# Patient Record
Sex: Male | Born: 1965
Health system: Southern US, Community
[De-identification: ages and names within clinical notes are randomized; demographics above are authoritative.]

## PROBLEM LIST (undated history)

## (undated) DIAGNOSIS — R011 Cardiac murmur, unspecified: Secondary | ICD-10-CM

## (undated) DIAGNOSIS — E3120 Multiple endocrine neoplasia [MEN] syndrome, unspecified: Secondary | ICD-10-CM

---

## 2015-03-14 HISTORY — PX: COLONOSCOPY: SHX174

## 2015-04-16 ENCOUNTER — Ambulatory Visit: Payer: Self-pay | Admitting: General Surgery

## 2015-04-16 NOTE — H&P (Signed)
Robert Sloan 04/03/2015 1:20 PM Location: [facility] Patient #: [id number] DOB: 1966-01-24 Married / Language: [place] / Race: White Male   History of Present Illness [name] MD; 04/03/2015 2:10 PM) Patient words: umbilical hernia.  The patient is a 50 year old male.  Note:He is referred by Dr. Loreta Ave for consultation on a left inguinal hernia. He has noticed a bulge for some time that has progressively enlarged and migrated into his left scrotum. He states he gets a funny sensation there at times but no significant pain. No difficulty with urination or chronic constipation. He does not do a lot of repetitive heavy lifting. His wife is here with him today. His twin brother had an inguinal hernia repair done about a year ago.  Problem List/Past Medical [name], MD; 04/03/2015 2:06 PM) MEN 1 (MULTIPLE ENDOCRINE NEOPLASIA) (E31.21)  Other Problems [name], MD; 04/03/2015 2:06 PM) Back Pain Heart murmur Inguinal Hernia Other disease, cancer, significant illness  Past Surgical History [name], CMA; 04/03/2015 1:29 PM) Oral Surgery Vasectomy  Diagnostic Studies History [name], CMA; 04/03/2015 1:29 PM) Colonoscopy within last year  Allergies [name], CMA; 04/03/2015 1:30 PM) No Known Drug Allergies02/21/2017  Medication History [name], CMA; 04/03/2015 1:30 PM) Medications Reconciled  Social History [name], CMA; 04/03/2015 1:29 PM) Alcohol use Occasional alcohol use. Caffeine use Carbonated beverages, Coffee. No drug use Tobacco use Never smoker.  Family History [name], [place]; 04/03/2015 1:29 PM) Cancer Father. Hypertension Father. Melanoma Mother.    Review of Systems [name] CMA; 04/03/2015 1:29 PM) General Not Present- Appetite Loss, Chills, Fatigue, Fever, Night Sweats, Weight Gain and Weight Loss. Skin Not Present- Change in Wart/Mole, Dryness, Hives, Jaundice,  New Lesions, Non-Healing Wounds, Rash and Ulcer. HEENT Present- Wears glasses/contact lenses. Not Present- Earache, Hearing Loss, Hoarseness, Nose Bleed, Oral Ulcers, Ringing in the Ears, Seasonal Allergies, Sinus Pain, Sore Throat, Visual Disturbances and Yellow Eyes. Respiratory Not Present- Bloody sputum, Chronic Cough, Difficulty Breathing, Snoring and Wheezing. Breast Not Present- Breast Mass, Breast Pain, Nipple Discharge and Skin Changes. Cardiovascular Not Present- Chest Pain, Difficulty Breathing Lying Down, Leg Cramps, Palpitations, Rapid Heart Rate, Shortness of Breath and Swelling of Extremities. Gastrointestinal Present- Abdominal Pain and Bloating. Not Present- Bloody Stool, Change in Bowel Habits, Chronic diarrhea, Constipation, Difficulty Swallowing, Excessive gas, Gets full quickly at meals, Hemorrhoids, Indigestion, Nausea, Rectal Pain and Vomiting. Male Genitourinary Not Present- Blood in Urine, Change in Urinary Stream, Frequency, Impotence, Nocturia, Painful Urination, Urgency and Urine Leakage. Musculoskeletal Present- Back Pain. Not Present- Joint Pain, Joint Stiffness, Muscle Pain, Muscle Weakness and Swelling of Extremities. Neurological Not Present- Decreased Memory, Fainting, Headaches, Numbness, Seizures, Tingling, Tremor, Trouble walking and Weakness. Psychiatric Not Present- Anxiety, Bipolar, Change in Sleep Pattern, Depression, Fearful and Frequent crying. Endocrine Not Present- Cold Intolerance, Excessive Hunger, Hair Changes, Heat Intolerance, Hot flashes and New Diabetes. Hematology Not Present- Easy Bruising, Excessive bleeding, Gland problems, HIV and Persistent Infections.  Vitals [name] CMA; 04/03/2015 1:30 PM) 04/03/2015 1:30 PM Weight: 177.6 lb Height: 70in Body Surface Area: 1.98 m Body Mass Index: 25.48 kg/m  Temp.: 98.5F(Oral)  Pulse: 62 (Regular)  BP: 110/60 (Sitting, Left Arm, Standard)       Physical Exam [name]  MD; 04/03/2015 2:12 PM) The physical exam findings are as follows: Note:General: WDWN in NAD. Pleasant and cooperative.  CV: RRR, no murmur, no JVD.  CHEST: Breath sounds equal and clear. Respirations nonlabored.  ABDOMEN: Soft, nontender,  nondistended, no umbilical hernia  GU: Left inguinal bulge extending down into his scrotum abutting the testicle. This is able to be reduced with some effort in the supine position. No right inguinal bulge. Both testicles are present without masses palpable.  SKIN: No jaundice.  NEUROLOGIC: Alert and oriented, answers questions appropriately, normal gait and station.  PSYCHIATRIC: Normal mood, affect , and behavior.    Assessment & Plan [name]([name] MD; 04/03/2015 2:08 PM) LEFT INGUINAL HERNIA (K40.90) Impression: This extends down into his scrotum and abuts his testicle. It is reducible with some effort in the supine position. Because of this, I think the open repair would be better.  Plan: Open left inguinal hernia repair with mesh. I have explained the procedure, risks, and aftercare of inguinal hernia repair. Risks include but are not limited to bleeding, infection, wound problems, anesthesia, recurrence, bladder or intestine injury, urinary retention, testicular dysfunction, chronic pain, mesh problems. He seems to understand and agrees to proceed.   Avel Peaceodd Mykell Genao, MD

## 2015-04-19 NOTE — Pre-Procedure Instructions (Addendum)
Robert Sloan  04/19/2015     No Pharmacies Listed   Your procedure is scheduled on Thursday, March 16th, 2017.  Report to [facility] Admitting at 5:30 A.M.   Call this number if you have problems the morning of surgery:  [phone]   Remember:  Do not eat food or drink liquids after midnight.   Take these medicines the morning of surgery with A SIP OF WATER: Acetaminophen (Tylenol) if needed.   5 days prior to surgery, stop taking: Aspirin, NSAIDs, Aleve, Naproxen, Ibuprofen, Advil, Motrin, BC's, Goody's, Fish oil, all herbal medications, and all vitamins.    Do not wear jewelry.  Do not wear lotions, powders, or colognes.    Men may shave face and neck.  Do not bring valuables to the hospital.   [facility] is not responsible for any belongings or valuables.  Contacts, dentures or bridgework may not be worn into surgery.  Leave your suitcase in the car.  After surgery it may be brought to your room.  For patients admitted to the hospital, discharge time will be determined by your treatment team.  Patients discharged the day of surgery will not be allowed to drive home.   Special instructions:  Special Instructions: [place] - Preparing for Surgery  Before surgery, you can play an important role.  Because skin is not sterile, your skin needs to be as free of germs as possible.  You can reduce the number of germs on you skin by washing with CHG (chlorahexidine gluconate) soap before surgery.  CHG is an antiseptic cleaner which kills germs and bonds with the skin to continue killing germs even after washing.  Please DO NOT use if you have an allergy to CHG or antibacterial soaps.  If your skin becomes reddened/irritated stop using the CHG and inform your nurse when you arrive at Short Stay.  Do not shave (including legs and underarms) for at least 48 hours prior to the first CHG shower.  You may shave your face.  Please follow these instructions  carefully:   1.  Shower with CHG Soap the night before surgery and the morning of Surgery.  2.  If you choose to wash your hair, wash your hair first as usual with your normal shampoo.  3.  After you shampoo, rinse your hair and body thoroughly to remove the Shampoo.  4.  Use CHG as you would any other liquid soap.  You can apply chg directly  to the skin and wash gently with scrungie or a clean washcloth.  5.  Apply the CHG Soap to your body ONLY FROM THE NECK DOWN.  Do not use on open wounds or open sores.  Avoid contact with your eyes ears, mouth and genitals (private parts).  Wash genitals (private parts)       with your normal soap.  6.  Wash thoroughly, paying special attention to the area where your surgery will be performed.  7.  Thoroughly rinse your body with warm water from the neck down.  8.  DO NOT shower/wash with your normal soap after using and rinsing off the CHG Soap.  9.  Pat yourself dry with a clean towel.            10.  Wear clean pajamas.            11.  Place clean sheets on your bed the night of your first shower and do not sleep with pets.  Day  of Surgery  Do not apply any lotions/deodorants the morning of surgery.  Please wear clean clothes to the hospital/surgery center.   Please read over the following fact sheets that you were given. Pain Booklet, Coughing and Deep Breathing and Surgical Site Infection Prevention

## 2015-04-20 ENCOUNTER — Encounter (HOSPITAL_COMMUNITY): Payer: Self-pay

## 2015-04-20 ENCOUNTER — Encounter (HOSPITAL_COMMUNITY)
Admission: RE | Admit: 2015-04-20 | Discharge: 2015-04-20 | Disposition: A | Discharge status: Home / Self Care | Payer: BLUE CROSS/BLUE SHIELD | Source: Ambulatory Visit | Attending: General Surgery | Admitting: General Surgery

## 2015-04-20 DIAGNOSIS — Z01812 Encounter for preprocedural laboratory examination: Secondary | ICD-10-CM | POA: Diagnosis not present

## 2015-04-20 DIAGNOSIS — K4090 Unilateral inguinal hernia, without obstruction or gangrene, not specified as recurrent: Secondary | ICD-10-CM | POA: Insufficient documentation

## 2015-04-20 DIAGNOSIS — E3121 Multiple endocrine neoplasia [MEN] type I: Secondary | ICD-10-CM | POA: Diagnosis not present

## 2015-04-20 DIAGNOSIS — Z01818 Encounter for other preprocedural examination: Secondary | ICD-10-CM | POA: Insufficient documentation

## 2015-04-20 HISTORY — DX: Multiple endocrine neoplasia (MEN) syndrome, unspecified: E31.20

## 2015-04-20 HISTORY — DX: Cardiac murmur, unspecified: R01.1

## 2015-04-20 LAB — CBC WITH DIFFERENTIAL/PLATELET
BASOS ABS: 0 10*3/uL (ref 0.0–0.1)
BASOS PCT: 1 %
EOS ABS: 0.1 10*3/uL (ref 0.0–0.7)
EOS PCT: 2 %
HCT: 39.6 % (ref 39.0–52.0)
Hemoglobin: 12.9 g/dL — ABNORMAL LOW (ref 13.0–17.0)
LYMPHS PCT: 38 %
Lymphs Abs: 1.8 10*3/uL (ref 0.7–4.0)
MCH: 30.3 pg (ref 26.0–34.0)
MCHC: 32.6 g/dL (ref 30.0–36.0)
MCV: 93 fL (ref 78.0–100.0)
Monocytes Absolute: 0.4 10*3/uL (ref 0.1–1.0)
Monocytes Relative: 8 %
Neutro Abs: 2.4 10*3/uL (ref 1.7–7.7)
Neutrophils Relative %: 51 %
PLATELETS: 209 10*3/uL (ref 150–400)
RBC: 4.26 MIL/uL (ref 4.22–5.81)
RDW: 12.8 % (ref 11.5–15.5)
WBC: 4.7 10*3/uL (ref 4.0–10.5)

## 2015-04-20 LAB — COMPREHENSIVE METABOLIC PANEL
ALK PHOS: 83 U/L (ref 38–126)
ALT: 19 U/L (ref 17–63)
ANION GAP: 10 (ref 5–15)
AST: 21 U/L (ref 15–41)
Albumin: 3.9 g/dL (ref 3.5–5.0)
BUN: 17 mg/dL (ref 6–20)
CALCIUM: 10.8 mg/dL — AB (ref 8.9–10.3)
CO2: 24 mmol/L (ref 22–32)
CREATININE: 0.92 mg/dL (ref 0.61–1.24)
Chloride: 107 mmol/L (ref 101–111)
Glucose, Bld: 93 mg/dL (ref 65–99)
Potassium: 4.1 mmol/L (ref 3.5–5.1)
SODIUM: 141 mmol/L (ref 135–145)
Total Bilirubin: 0.5 mg/dL (ref 0.3–1.2)
Total Protein: 6.5 g/dL (ref 6.5–8.1)

## 2015-04-23 ENCOUNTER — Encounter (HOSPITAL_COMMUNITY): Payer: Self-pay

## 2015-04-23 NOTE — Progress Notes (Signed)
Anesthesia Chart Review:  Pt is a 50 year old male scheduled for open L inguinal hernia repair with mesh on 04/26/2015 wi9th Dr. Abbey Chattersosenbower.   PMH includes:  Heart murmur, MEA type 1. Never smoker. BMI 25  Preoperative labs reviewed.    EKG will need to be obtained DOS.   Echo 03/01/15:  - Normal LV size. LV systolic function normal. EF 55-60% - Normal chamber sizes and wall motion throughout - LV normal in size and contractility - Trace MR and PR and TR  If EKG acceptable DOS, I anticipate pt can proceed as scheduled.   Rica Mastngela Kabbe, FNP-BC [facility]Anesthesiology Phone: [phone] 04/23/2015 1:21 PM

## 2015-04-25 MED ORDER — CEFAZOLIN SODIUM-DEXTROSE 2-3 GM-% IV SOLR
2.0000 g | INTRAVENOUS | Status: AC
  Administered 2015-04-26: 2 g via INTRAVENOUS
  Filled 2015-04-25 (×2): qty 50

## 2015-04-26 ENCOUNTER — Ambulatory Visit (HOSPITAL_COMMUNITY): Payer: BLUE CROSS/BLUE SHIELD | Admitting: Certified Registered"

## 2015-04-26 ENCOUNTER — Encounter (HOSPITAL_COMMUNITY): Payer: Self-pay | Admitting: *Deleted

## 2015-04-26 ENCOUNTER — Encounter (HOSPITAL_COMMUNITY)
Admission: RE | Disposition: A | Discharge status: Home / Self Care | Payer: Self-pay | Source: Ambulatory Visit | Attending: General Surgery

## 2015-04-26 ENCOUNTER — Ambulatory Visit (HOSPITAL_COMMUNITY)
Admission: RE | Admit: 2015-04-26 | Discharge: 2015-04-26 | Disposition: A | Discharge status: Home / Self Care | Payer: BLUE CROSS/BLUE SHIELD | Source: Ambulatory Visit | Attending: General Surgery | Admitting: General Surgery

## 2015-04-26 ENCOUNTER — Ambulatory Visit (HOSPITAL_COMMUNITY): Payer: BLUE CROSS/BLUE SHIELD | Admitting: Emergency Medicine

## 2015-04-26 DIAGNOSIS — E3121 Multiple endocrine neoplasia [MEN] type I: Secondary | ICD-10-CM | POA: Diagnosis not present

## 2015-04-26 DIAGNOSIS — K4090 Unilateral inguinal hernia, without obstruction or gangrene, not specified as recurrent: Secondary | ICD-10-CM | POA: Diagnosis present

## 2015-04-26 HISTORY — PX: INGUINAL HERNIA REPAIR: SHX194

## 2015-04-26 HISTORY — PX: INSERTION OF MESH: SHX5868

## 2015-04-26 SURGERY — REPAIR, HERNIA, INGUINAL, ADULT
Anesthesia: Regional | Site: Groin | Laterality: Left

## 2015-04-26 MED ORDER — LIDOCAINE HCL (CARDIAC) 20 MG/ML IV SOLN
INTRAVENOUS | Status: DC | PRN
  Administered 2015-04-26: 60 mg via INTRAVENOUS

## 2015-04-26 MED ORDER — PHENYLEPHRINE HCL 10 MG/ML IJ SOLN
INTRAMUSCULAR | Status: DC | PRN
  Administered 2015-04-26 (×2): 40 ug via INTRAVENOUS

## 2015-04-26 MED ORDER — ONDANSETRON HCL 4 MG/2ML IJ SOLN
INTRAMUSCULAR | Status: DC | PRN
  Administered 2015-04-26: 4 mg via INTRAVENOUS

## 2015-04-26 MED ORDER — ONDANSETRON HCL 4 MG/2ML IJ SOLN: 4.0000 mg | Freq: Four times a day (QID) | INTRAMUSCULAR | Status: DC | PRN

## 2015-04-26 MED ORDER — MIDAZOLAM HCL 2 MG/2ML IJ SOLN
INTRAMUSCULAR | Status: AC
  Filled 2015-04-26: qty 2

## 2015-04-26 MED ORDER — 0.9 % SODIUM CHLORIDE (POUR BTL) OPTIME
TOPICAL | Status: DC | PRN
  Administered 2015-04-26: 1000 mL

## 2015-04-26 MED ORDER — MIDAZOLAM HCL 5 MG/5ML IJ SOLN
INTRAMUSCULAR | Status: DC | PRN
  Administered 2015-04-26: 2 mg via INTRAVENOUS

## 2015-04-26 MED ORDER — OXYCODONE HCL 5 MG PO TABS: 5.0000 mg | ORAL_TABLET | ORAL | Status: DC | PRN

## 2015-04-26 MED ORDER — FENTANYL CITRATE (PF) 100 MCG/2ML IJ SOLN
INTRAMUSCULAR | Status: AC
  Administered 2015-04-26 (×2): 100 ug via INTRAVENOUS
  Filled 2015-04-26: qty 2

## 2015-04-26 MED ORDER — PROPOFOL 10 MG/ML IV BOLUS
INTRAVENOUS | Status: DC | PRN
  Administered 2015-04-26: 200 mg via INTRAVENOUS

## 2015-04-26 MED ORDER — ROCURONIUM BROMIDE 50 MG/5ML IV SOLN
INTRAVENOUS | Status: AC
  Filled 2015-04-26: qty 1

## 2015-04-26 MED ORDER — HYDROMORPHONE HCL 1 MG/ML IJ SOLN
INTRAMUSCULAR | Status: AC
  Filled 2015-04-26: qty 1

## 2015-04-26 MED ORDER — SUCCINYLCHOLINE CHLORIDE 20 MG/ML IJ SOLN
INTRAMUSCULAR | Status: AC
  Filled 2015-04-26: qty 1

## 2015-04-26 MED ORDER — BUPIVACAINE-EPINEPHRINE (PF) 0.5% -1:200000 IJ SOLN
INTRAMUSCULAR | Status: DC | PRN
  Administered 2015-04-26: 30 mL via PERINEURAL

## 2015-04-26 MED ORDER — BUPIVACAINE-EPINEPHRINE 0.5% -1:200000 IJ SOLN
INTRAMUSCULAR | Status: DC | PRN
  Administered 2015-04-26: 13 mL

## 2015-04-26 MED ORDER — HYDROMORPHONE HCL 1 MG/ML IJ SOLN
0.2500 mg | INTRAMUSCULAR | Status: DC | PRN
  Administered 2015-04-26 (×2): 0.5 mg via INTRAVENOUS

## 2015-04-26 MED ORDER — PROPOFOL 10 MG/ML IV BOLUS
INTRAVENOUS | Status: AC
  Filled 2015-04-26: qty 20

## 2015-04-26 MED ORDER — ROCURONIUM BROMIDE 100 MG/10ML IV SOLN
INTRAVENOUS | Status: DC | PRN
  Administered 2015-04-26: 10 mg via INTRAVENOUS
  Administered 2015-04-26: 40 mg via INTRAVENOUS

## 2015-04-26 MED ORDER — OXYCODONE HCL 5 MG/5ML PO SOLN: 5.0000 mg | Freq: Once | ORAL | Status: DC | PRN

## 2015-04-26 MED ORDER — SUGAMMADEX SODIUM 200 MG/2ML IV SOLN
INTRAVENOUS | Status: DC | PRN
  Administered 2015-04-26: 200 mg via INTRAVENOUS

## 2015-04-26 MED ORDER — OXYCODONE HCL 5 MG PO TABS: 5.0000 mg | ORAL_TABLET | Freq: Once | ORAL | Status: DC | PRN

## 2015-04-26 MED ORDER — LACTATED RINGERS IV SOLN
INTRAVENOUS | Status: DC | PRN
  Administered 2015-04-26 (×2): via INTRAVENOUS

## 2015-04-26 MED ORDER — LIDOCAINE HCL (CARDIAC) 20 MG/ML IV SOLN
INTRAVENOUS | Status: AC
  Filled 2015-04-26: qty 5

## 2015-04-26 MED ORDER — SUGAMMADEX SODIUM 200 MG/2ML IV SOLN
INTRAVENOUS | Status: AC
  Filled 2015-04-26: qty 2

## 2015-04-26 MED ORDER — FENTANYL CITRATE (PF) 250 MCG/5ML IJ SOLN
INTRAMUSCULAR | Status: AC
  Filled 2015-04-26: qty 5

## 2015-04-26 MED ORDER — ONDANSETRON HCL 4 MG/2ML IJ SOLN
INTRAMUSCULAR | Status: AC
  Filled 2015-04-26: qty 2

## 2015-04-26 MED ORDER — BUPIVACAINE-EPINEPHRINE (PF) 0.5% -1:200000 IJ SOLN
INTRAMUSCULAR | Status: AC
  Filled 2015-04-26: qty 30

## 2015-04-26 SURGICAL SUPPLY — 45 items
BENZOIN TINCTURE PRP APPL 2/3 (GAUZE/BANDAGES/DRESSINGS) ×3 IMPLANT
BLADE SURG 10 STRL SS (BLADE) ×3 IMPLANT
BLADE SURG 15 STRL LF DISP TIS (BLADE) ×1 IMPLANT
BLADE SURG 15 STRL SS (BLADE) ×2
BLADE SURG ROTATE 9660 (MISCELLANEOUS) IMPLANT
CHLORAPREP W/TINT 26ML (MISCELLANEOUS) ×3 IMPLANT
CLOSURE WOUND 1/2 X4 (GAUZE/BANDAGES/DRESSINGS) ×1
COVER SURGICAL LIGHT HANDLE (MISCELLANEOUS) ×3 IMPLANT
DRAIN PENROSE 1/2X12 LTX STRL (WOUND CARE) IMPLANT
DRAPE INCISE IOBAN 66X45 STRL (DRAPES) ×3 IMPLANT
DRAPE LAPAROTOMY TRNSV 102X78 (DRAPE) ×3 IMPLANT
DRAPE UTILITY XL STRL (DRAPES) ×6 IMPLANT
DRSG TEGADERM 4X4.75 (GAUZE/BANDAGES/DRESSINGS) ×3 IMPLANT
DRSG TELFA 3X8 NADH (GAUZE/BANDAGES/DRESSINGS) ×3 IMPLANT
ELECT CAUTERY BLADE 6.4 (BLADE) ×3 IMPLANT
ELECT REM PT RETURN 9FT ADLT (ELECTROSURGICAL) ×3
ELECTRODE REM PT RTRN 9FT ADLT (ELECTROSURGICAL) ×1 IMPLANT
GAUZE SPONGE 4X4 16PLY XRAY LF (GAUZE/BANDAGES/DRESSINGS) ×3 IMPLANT
GLOVE BIOGEL PI IND STRL 8 (GLOVE) ×1 IMPLANT
GLOVE BIOGEL PI INDICATOR 8 (GLOVE) ×2
GLOVE ECLIPSE 8.0 STRL XLNG CF (GLOVE) ×3 IMPLANT
GOWN STRL REUS W/ TWL LRG LVL3 (GOWN DISPOSABLE) ×2 IMPLANT
GOWN STRL REUS W/TWL LRG LVL3 (GOWN DISPOSABLE) ×4
KIT BASIN OR (CUSTOM PROCEDURE TRAY) ×3 IMPLANT
KIT ROOM TURNOVER OR (KITS) ×3 IMPLANT
MESH HERNIA 3X6 (Mesh General) ×3 IMPLANT
NEEDLE HYPO 25GX1X1/2 BEV (NEEDLE) ×3 IMPLANT
NS IRRIG 1000ML POUR BTL (IV SOLUTION) ×3 IMPLANT
PACK SURGICAL SETUP 50X90 (CUSTOM PROCEDURE TRAY) ×3 IMPLANT
PAD ARMBOARD 7.5X6 YLW CONV (MISCELLANEOUS) ×3 IMPLANT
PENCIL BUTTON HOLSTER BLD 10FT (ELECTRODE) ×3 IMPLANT
SPECIMEN JAR SMALL (MISCELLANEOUS) IMPLANT
SPONGE GAUZE 4X4 12PLY STER LF (GAUZE/BANDAGES/DRESSINGS) ×3 IMPLANT
SPONGE LAP 18X18 X RAY DECT (DISPOSABLE) ×3 IMPLANT
STRIP CLOSURE SKIN 1/2X4 (GAUZE/BANDAGES/DRESSINGS) ×2 IMPLANT
SUT MON AB 4-0 PC3 18 (SUTURE) ×3 IMPLANT
SUT PROLENE 2 0 CT2 30 (SUTURE) ×6 IMPLANT
SUT SILK 2 0 SH (SUTURE) IMPLANT
SUT VIC AB 2-0 SH 18 (SUTURE) ×3 IMPLANT
SUT VIC AB 3-0 SH 27 (SUTURE) ×2
SUT VIC AB 3-0 SH 27XBRD (SUTURE) ×1 IMPLANT
SUT VICRYL AB 3 0 TIES (SUTURE) ×3 IMPLANT
SYR CONTROL 10ML LL (SYRINGE) ×3 IMPLANT
TOWEL OR 17X24 6PK STRL BLUE (TOWEL DISPOSABLE) ×3 IMPLANT
TOWEL OR 17X26 10 PK STRL BLUE (TOWEL DISPOSABLE) ×3 IMPLANT

## 2015-04-26 NOTE — Progress Notes (Signed)
Iv infusing on arrival to pacu 

## 2015-04-26 NOTE — Op Note (Signed)
OPERATIVE NOTE:  INGUINAL HERNIA REPAIR  Preoperative diagnosis:  Large left inguinal hernia.  Postoperative diagnosis:  Same  Procedure:  Left inguinal hernia repair with mesh.  Surgeon:  Avel Peaceodd Terie Lear, M.D.  Anesthesia:  General/LMA, TAP block, and local (Marcaine).  Indication:  This is a 50 year old male with a large left inguinal hernia radiating down into his left hemiscrotum.  He presents for elective repair.  Technique:  He was seen in the holding room and the left groin was marked with my initials. He was brought to the operating, placed supine on the operating table, and the anesthetic was administered by the anesthesiologist. The hair in the left groin area was clipped as was felt to be necessary. This area was then sterilely prepped and draped. A time out was performed.  Local anesthetic was infiltrated in the superficial and deep tissues in the left groin.  An incision was made through the skin and subcutaneous tissue until the external oblique aponeurosis was identified.  Local anesthetic was infiltrated deep to the external oblique aponeurosis. The external oblique aponeurosis was divided through the external ring medially and back toward the anterior superior iliac spine laterally. Using blunt dissection, the shelving edge of the inguinal ligament was identified inferiorly and the internal oblique aponeurosis and muscle were identified superiorly. The ilioinguinal nerve was identified and preserved.  The spermatic cord was isolated and a posterior window was made around it. A large indirect hernia sac was identified, reduced out of the scrotum,and separated from the spermatic cord using blunt dissection. The hernia sac and its contents were reduced through the indirect hernia defect.   A piece of 3" x 6" polypropylene mesh was brought into the field and anchored 1-2 cm medial to the pubic tubercle with 2-0 Prolene suture. The inferior aspect of the mesh was anchored to the  shelving edge of the inguinal ligament with running 2-0 Prolene suture to a level 1-2 cm lateral to the internal ring. A slit was cut in the mesh creating 2 tails. These were wrapped around the spermatic cord. The superior aspect of the mesh was anchored to the internal oblique aponeurosis and muscle with interrupted 2-0 Vicryl sutures. The 2 tails of the mesh were then crossed creating a new internal ring and were anchored to the shelving edge of the inguinal ligament with 2-0 Prolene suture. The tip of a hemostat could be placed through the new aperture. The lateral aspect of the mesh was then tucked deep to the external oblique aponeurosis.  The wound was inspected and hemostasis was adequate. The external oblique aponeurosis was then closed over the mesh and cord with running 3-0 Vicryl suture. The subcutaneous tissue was closed with running 3-0 Vicryl suture. The skin closed with a running 4-0 Monocryl subcuticular stitch.  Steri-Strips and a sterile dressing were applied.  The procedure was well-tolerated without any apparent complications and he was taken to the recovery room in satisfactory condition.

## 2015-04-26 NOTE — Anesthesia Preprocedure Evaluation (Signed)
Anesthesia Evaluation  Patient identified by MRN, date of birth, ID band Patient awake    Reviewed: Allergy & Precautions, NPO status , Patient's Chart, lab work & pertinent test results  Airway Mallampati: II   Neck ROM: full    Dental   Pulmonary neg pulmonary ROS,    breath sounds clear to auscultation       Cardiovascular negative cardio ROS   Rhythm:regular Rate:Normal     Neuro/Psych    GI/Hepatic   Endo/Other  Multiple endocrine adenomatosis  Renal/GU      Musculoskeletal   Abdominal   Peds  Hematology   Anesthesia Other Findings   Reproductive/Obstetrics                             Anesthesia Physical Anesthesia Plan  ASA: II  Anesthesia Plan: General and Regional   Post-op Pain Management: MAC Combined w/ Regional for Post-op pain   Induction: Intravenous  Airway Management Planned: LMA  Additional Equipment:   Intra-op Plan:   Post-operative Plan:   Informed Consent: I have reviewed the patients History and Physical, chart, labs and discussed the procedure including the risks, benefits and alternatives for the proposed anesthesia with the patient or authorized representative who has indicated his/her understanding and acceptance.     Plan Discussed with: CRNA, Anesthesiologist and Surgeon  Anesthesia Plan Comments:         Anesthesia Quick Evaluation

## 2015-04-26 NOTE — Transfer of Care (Signed)
Immediate Anesthesia Transfer of Care Note  Patient: Robert InglesDaniel D Sloan  Procedure(s) Performed: Procedure(s): OPEN LEFT INGUINAL HERNIA REPAIR WITH MESH (Left) INSERTION OF MESH (Left)  Patient Location: PACU  Anesthesia Type:General  Level of Consciousness: awake, alert  and oriented  Airway & Oxygen Therapy: Patient connected to face mask oxygen  Post-op Assessment: Report given to RN  Post vital signs: stable  Last Vitals:  Filed Vitals:   04/26/15 0602  BP: 103/47  Pulse: 61  Temp: 36.6 C    Complications: No apparent anesthesia complications

## 2015-04-26 NOTE — Anesthesia Procedure Notes (Addendum)
Procedure Name: Intubation Date/Time: 04/26/2015 7:24 AM Performed by: Dorie RankQUINN, HOLLY M Pre-anesthesia Checklist: Patient identified, Emergency Drugs available, Suction available, Patient being monitored and Timeout performed Patient Re-evaluated:Patient Re-evaluated prior to inductionOxygen Delivery Method: Circle system utilized Preoxygenation: Pre-oxygenation with 100% oxygen Intubation Type: IV induction Ventilation: Mask ventilation without difficulty Laryngoscope Size: Mac and 3 Grade View: Grade III Tube type: Oral Tube size: 7.5 mm Number of attempts: 1 Airway Equipment and Method: Stylet Placement Confirmation: ETT inserted through vocal cords under direct vision,  positive ETCO2 and breath sounds checked- equal and bilateral Secured at: 22 cm Tube secured with: Tape Dental Injury: Teeth and Oropharynx as per pre-operative assessment  Comments: Anterior airway noted.  ETT passed easily. Dr Chaney MallingHodierne verified placement.  Zigmund GottronH Quinn, CRNA   Anesthesia Regional Block:  TAP block  Pre-Anesthetic Checklist: ,, timeout performed, Correct Patient, Correct Site, Correct Laterality, Correct Procedure, Correct Position, site marked, Risks and benefits discussed,  Surgical consent,  Pre-op evaluation,  At surgeon's request and post-op pain management  Laterality: Left  Prep: chloraprep       Needles:  Injection technique: Single-shot  Needle Type: Echogenic Needle     Needle Length: 9cm 9 cm Needle Gauge: 21 and 21 G    Additional Needles:  Procedures: ultrasound guided (picture in chart) TAP block Narrative:  Start time: 04/26/2015 7:01 AM End time: 04/26/2015 7:09 AM Injection made incrementally with aspirations every 5 mL.  Performed by: Personally  Anesthesiologist: Abrial Arrighi  Additional Notes: Pt tolerated the procedure well.

## 2015-04-26 NOTE — Discharge Instructions (Signed)
[facility] _______[facility]   INGUINAL HERNIA REPAIR: POST OP INSTRUCTIONS  Always review your discharge instruction sheet given to you by the facility where your surgery was performed. IF YOU HAVE DISABILITY OR FAMILY LEAVE FORMS, YOU MUST BRING THEM TO THE OFFICE FOR PROCESSING.   DO NOT GIVE THEM TO YOUR DOCTOR.  1. A  prescription for pain medication may be given to you upon discharge.  Take your pain medication as prescribed, if needed.  If narcotic pain medicine is not needed, then you may take acetaminophen (Tylenol) or ibuprofen (Advil) as needed. 2. Take your usually prescribed medications unless otherwise directed. 3. If you need a refill on your pain medication, please contact your pharmacy.  They will contact our office to request authorization. Prescriptions will not be filled after 5 pm or on week-ends. 4. You should follow a light diet the first 24 hours after arrival home, such as soup and crackers, etc.  Be sure to include lots of fluids daily.  Resume your normal diet the day after surgery. 5. Most patients will experience some swelling and bruising around the umbilicus or in the groin and scrotum.  Ice packs and reclining will help.  Swelling and bruising can take several days to weeks to resolve.  6. It is common to experience some constipation if taking pain medication after surgery.  Increasing fluid intake and taking a stool softener (such as Colace) will usually help or prevent this problem from occurring.  A mild laxative (Milk of Magnesia or Miralax) should be taken according to package directions if there are no bowel movements after 48 hours. 7. Unless discharge instructions indicate otherwise, you may remove your bandages 72 hours after surgery, and you may shower at that time.  You may have steri-strips (small skin tapes) in place directly over the incision.  These strips should be left on the skin.  If your surgeon used skin glue on the incision, you may  shower in 24 hours.  The glue will flake off over the next 2-3 weeks.  Any sutures or staples will be removed at the office during your follow-up visit. 8. ACTIVITIES:  You may resume regular (light) daily activities beginning the next day--such as daily self-care, walking, climbing stairs--gradually increasing activities as tolerated.  You may have sexual intercourse when it is comfortable.  Refrain from any heavy lifting or straining-nothing over 10 pounds for 6 weeks.  a. You may drive when you are no longer taking prescription pain medication, you can comfortably wear a seatbelt, and you can safely maneuver your car and apply brakes. b. RETURN TO WORK: Desk type work in 1 week, full duty in 6 weeks __________________________________________________________ 9. You should see your doctor in the office for a follow-up appointment approximately 2-3 weeks after your surgery.  Make sure that you call for this appointment within a day or two after you arrive home to insure a convenient appointment time. 10. OTHER INSTRUCTIONS:  __________________________________________________________________________________________________________________________________________________________________________________________  WHEN TO CALL YOUR DOCTOR: 1. Fever over 101.0 2. Inability to urinate 3. Nausea and/or vomiting 4. Extreme swelling or bruising 5. Continued bleeding from incision. 6. Increased pain, redness, or drainage from the incision  The clinic staff is available to answer your questions during regular business hours.  Please dont hesitate to call and ask to speak to one of the nurses for clinical concerns.  If you have a medical emergency, go to the nearest emergency room or call 911.  A Careers adviser from [facility] is always on call at the hospital   [address], [address], [place], [place]  [address] ?  [address] [address], [place], [place]   [address] [phone] ? [phone] ? FAX  [phone] Web site: [email or web address]

## 2015-04-26 NOTE — Interval H&P Note (Signed)
History and Physical Interval Note:  04/26/2015 7:10 AM  Robert Sloan  has presented today for surgery, with the diagnosis of Left inguinal hernia  The various methods of treatment have been discussed with the patient and family. After consideration of risks, benefits and other options for treatment, the patient has consented to  Procedure(s): OPEN LEFT INGUINAL HERNIA REPAIR WITH MESH (Left) INSERTION OF MESH (Left) as a surgical intervention .  The patient's history has been reviewed, patient examined, no change in status, stable for surgery.  I have reviewed the patient's chart and labs.  Questions were answered to the patient's satisfaction.     Katti Pelle JShela Commons

## 2015-04-26 NOTE — H&P (View-Only) (Signed)
Robert Sloan 04/03/2015 1:20 PM Location: [facility] Patient #: [id number] DOB: 03/26/1965 Married / Language: English / Race: White Male   History of Present Illness (Barbette Mcglaun J. Shaylon Aden MD; 04/03/2015 2:10 PM) Patient words: umbilical hernia.  The patient is a 49 year old male.  Note:He is referred by Dr. Mann for consultation on a left inguinal hernia. He has noticed a bulge for some time that has progressively enlarged and migrated into his left scrotum. He states he gets a funny sensation there at times but no significant pain. No difficulty with urination or chronic constipation. He does not do a lot of repetitive heavy lifting. His wife is here with him today. His twin brother had an inguinal hernia repair done about a year ago.  Problem List/Past Medical (Meric Joye J Henriette Hesser, MD; 04/03/2015 2:06 PM) MEN 1 (MULTIPLE ENDOCRINE NEOPLASIA) (E31.21)  Other Problems (Kimbrely Buckel J Vondell Sowell, MD; 04/03/2015 2:06 PM) Back Pain Heart murmur Inguinal Hernia Other disease, cancer, significant illness  Past Surgical History (Bernie Morris, CMA; 04/03/2015 1:29 PM) Oral Surgery Vasectomy  Diagnostic Studies History (Bernie Morris, CMA; 04/03/2015 1:29 PM) Colonoscopy within last year  Allergies (Bernie Morris, CMA; 04/03/2015 1:30 PM) No Known Drug Allergies02/21/2017  Medication History (Bernie Morris, CMA; 04/03/2015 1:30 PM) Medications Reconciled  Social History (Bernie Morris, CMA; 04/03/2015 1:29 PM) Alcohol use Occasional alcohol use. Caffeine use Carbonated beverages, Coffee. No drug use Tobacco use Never smoker.  Family History (Bernie Morris, CMA; 04/03/2015 1:29 PM) Cancer Father. Hypertension Father. Melanoma Mother.    Review of Systems (Bernie Morris CMA; 04/03/2015 1:29 PM) General Not Present- Appetite Loss, Chills, Fatigue, Fever, Night Sweats, Weight Gain and Weight Loss. Skin Not Present- Change in Wart/Mole, Dryness, Hives, Jaundice,  New Lesions, Non-Healing Wounds, Rash and Ulcer. HEENT Present- Wears glasses/contact lenses. Not Present- Earache, Hearing Loss, Hoarseness, Nose Bleed, Oral Ulcers, Ringing in the Ears, Seasonal Allergies, Sinus Pain, Sore Throat, Visual Disturbances and Yellow Eyes. Respiratory Not Present- Bloody sputum, Chronic Cough, Difficulty Breathing, Snoring and Wheezing. Breast Not Present- Breast Mass, Breast Pain, Nipple Discharge and Skin Changes. Cardiovascular Not Present- Chest Pain, Difficulty Breathing Lying Down, Leg Cramps, Palpitations, Rapid Heart Rate, Shortness of Breath and Swelling of Extremities. Gastrointestinal Present- Abdominal Pain and Bloating. Not Present- Bloody Stool, Change in Bowel Habits, Chronic diarrhea, Constipation, Difficulty Swallowing, Excessive gas, Gets full quickly at meals, Hemorrhoids, Indigestion, Nausea, Rectal Pain and Vomiting. Male Genitourinary Not Present- Blood in Urine, Change in Urinary Stream, Frequency, Impotence, Nocturia, Painful Urination, Urgency and Urine Leakage. Musculoskeletal Present- Back Pain. Not Present- Joint Pain, Joint Stiffness, Muscle Pain, Muscle Weakness and Swelling of Extremities. Neurological Not Present- Decreased Memory, Fainting, Headaches, Numbness, Seizures, Tingling, Tremor, Trouble walking and Weakness. Psychiatric Not Present- Anxiety, Bipolar, Change in Sleep Pattern, Depression, Fearful and Frequent crying. Endocrine Not Present- Cold Intolerance, Excessive Hunger, Hair Changes, Heat Intolerance, Hot flashes and New Diabetes. Hematology Not Present- Easy Bruising, Excessive bleeding, Gland problems, HIV and Persistent Infections.  Vitals (Bernie Morris CMA; 04/03/2015 1:30 PM) 04/03/2015 1:30 PM Weight: 177.6 lb Height: 70in Body Surface Area: 1.98 m Body Mass Index: 25.48 kg/m  Temp.: 98.4F(Oral)  Pulse: 62 (Regular)  BP: 110/60 (Sitting, Left Arm, Standard)       Physical Exam (Masiah Woody J. Tramayne Sebesta  MD; 04/03/2015 2:12 PM) The physical exam findings are as follows: Note:General: WDWN in NAD. Pleasant and cooperative.  CV: RRR, no murmur, no JVD.  CHEST: Breath sounds equal and clear. Respirations nonlabored.  ABDOMEN: Soft, nontender,   nondistended, no umbilical hernia  GU: Left inguinal bulge extending down into his scrotum abutting the testicle. This is able to be reduced with some effort in the supine position. No right inguinal bulge. Both testicles are present without masses palpable.  SKIN: No jaundice.  NEUROLOGIC: Alert and oriented, answers questions appropriately, normal gait and station.  PSYCHIATRIC: Normal mood, affect , and behavior.    Assessment & Plan (Annmarie Plemmons J. Lizzet Hendley MD; 04/03/2015 2:08 PM) LEFT INGUINAL HERNIA (K40.90) Impression: This extends down into his scrotum and abuts his testicle. It is reducible with some effort in the supine position. Because of this, I think the open repair would be better.  Plan: Open left inguinal hernia repair with mesh. I have explained the procedure, risks, and aftercare of inguinal hernia repair. Risks include but are not limited to bleeding, infection, wound problems, anesthesia, recurrence, bladder or intestine injury, urinary retention, testicular dysfunction, chronic pain, mesh problems. He seems to understand and agrees to proceed.   Gaylia Kassel, MD 

## 2015-04-27 ENCOUNTER — Encounter (HOSPITAL_COMMUNITY): Payer: Self-pay | Admitting: General Surgery

## 2015-05-01 NOTE — Anesthesia Postprocedure Evaluation (Signed)
Anesthesia Post Note  Patient: Robert InglesDaniel D Sloan  Procedure(s) Performed: Procedure(s) (LRB): OPEN LEFT INGUINAL HERNIA REPAIR WITH MESH (Left) INSERTION OF MESH (Left)  Patient location during evaluation: PACU Anesthesia Type: General Level of consciousness: awake and alert and patient cooperative Pain management: pain level controlled Vital Signs Assessment: post-procedure vital signs reviewed and stable Respiratory status: spontaneous breathing and respiratory function stable Cardiovascular status: stable Anesthetic complications: no    Last Vitals:  Filed Vitals:   04/26/15 1000 04/26/15 1015  BP: 106/69 101/82  Pulse: 54 63  Temp: 36.4 C   Resp: 12     Last Pain:  Filed Vitals:   04/26/15 1044  PainSc: 5                  Ashvin Adelson S

## 2015-06-22 DIAGNOSIS — M545 Low back pain: Secondary | ICD-10-CM | POA: Diagnosis not present

## 2015-06-22 DIAGNOSIS — M546 Pain in thoracic spine: Secondary | ICD-10-CM | POA: Diagnosis not present

## 2015-06-22 DIAGNOSIS — M542 Cervicalgia: Secondary | ICD-10-CM | POA: Diagnosis not present

## 2015-07-31 DIAGNOSIS — D2272 Melanocytic nevi of left lower limb, including hip: Secondary | ICD-10-CM | POA: Diagnosis not present

## 2015-07-31 DIAGNOSIS — D2261 Melanocytic nevi of right upper limb, including shoulder: Secondary | ICD-10-CM | POA: Diagnosis not present

## 2015-07-31 DIAGNOSIS — L700 Acne vulgaris: Secondary | ICD-10-CM | POA: Diagnosis not present

## 2015-07-31 DIAGNOSIS — B351 Tinea unguium: Secondary | ICD-10-CM | POA: Diagnosis not present

## 2016-01-29 DIAGNOSIS — R7301 Impaired fasting glucose: Secondary | ICD-10-CM | POA: Diagnosis not present

## 2016-01-29 DIAGNOSIS — Z13220 Encounter for screening for lipoid disorders: Secondary | ICD-10-CM | POA: Diagnosis not present

## 2016-01-29 DIAGNOSIS — Z23 Encounter for immunization: Secondary | ICD-10-CM | POA: Diagnosis not present

## 2016-01-29 DIAGNOSIS — R3129 Other microscopic hematuria: Secondary | ICD-10-CM | POA: Diagnosis not present

## 2016-01-29 DIAGNOSIS — Z0000 Encounter for general adult medical examination without abnormal findings: Secondary | ICD-10-CM | POA: Diagnosis not present

## 2016-01-29 DIAGNOSIS — Z125 Encounter for screening for malignant neoplasm of prostate: Secondary | ICD-10-CM | POA: Diagnosis not present

## 2016-02-12 ENCOUNTER — Ambulatory Visit: Payer: Self-pay | Admitting: General Surgery

## 2016-02-12 DIAGNOSIS — K4090 Unilateral inguinal hernia, without obstruction or gangrene, not specified as recurrent: Secondary | ICD-10-CM | POA: Diagnosis not present

## 2016-02-12 NOTE — H&P (Signed)
Robert InglesDaniel D Sloan 02/12/2016 11:40 AM Location: [facility] Patient #: [id number] DOB: 17-Nov-1965 Married / Language: [place] / Race: White Male  History of Present Illness Robert Sloan(Jami Ohlin J. Narjis Mira MD; 02/12/2016 12:05 PM) The patient is a 51 year old male.   Note:He presents today with his wife because he is developing thinks is a right inguinal hernia. A couple months ago he noticed a bulge on the right side that has progressively become larger. It causes mild discomfort at times. He is status post left inguinal hernia repair with mesh 04/26/15. This was a very large hernia extending into his scrotum. He had a prolonged period of postoperative pain but this eventually resolved. When he spoke with members of his family, multiple members have had hernia repairs in the past.  Allergies (April Staton, CMA; 02/12/2016 11:41 AM) No Known Drug Allergies 04/03/2015  Medication History (April Staton, CMA; 02/12/2016 11:41 AM) Vitamin D (Oral) Specific strength unknown - Active. No Current Medications (Taken starting 02/12/2016) Medications Reconciled    Vitals (April Staton CMA; 02/12/2016 11:41 AM) 02/12/2016 11:41 AM Weight: 181 lb Height: 70in Body Surface Area: 2 m Body Mass Index: 25.97 kg/m  Temp.: 98.37F(Oral)  Pulse: 65 (Regular)  BP: 118/78 (Sitting, Left Arm, Standard)      Physical Exam Robert Sloan(Edis Huish J. Breonna Gafford MD; 02/12/2016 12:05 PM)  The physical exam findings are as follows: Note:General-well-developed, well-nourished male in no acute distress.  Abdomen-soft, no umbilical hernia.  GU-left inguinal scar with solid floor. There is a right inguinal bulge extending down to the scrotum. This reduces spontaneously in the supine position. No testicular masses palpated.    Assessment & Plan Robert Sloan(Salimatou Simone J. Allyana Vogan MD; 02/12/2016 12:07 PM)  INGUINAL HERNIA OF RIGHT SIDE WITHOUT OBSTRUCTION OR GANGRENE (K40.90) Impression: This is mildly symptomatic. It also extends  down to the scrotum.  Plan: Open right inguinal hernia repair with mesh. I have explained the procedure, risks, and aftercare of inguinal hernia repair. Risks include but are not limited to bleeding, infection, wound problems, anesthesia, recurrence, bladder or intestine injury, urinary retention, testicular dysfunction, chronic pain, mesh problems. He seems to understand and agrees to proceed.  Avel Peaceodd Briteny Fulghum, M.D.

## 2016-02-20 NOTE — Pre-Procedure Instructions (Signed)
Robert Sloan  02/20/2016      [id number] - [place], [place] - [address] AT CORNER OF [facility] [address] [place] [address] Phone: [phone] Fax: [phone]    Your procedure is scheduled on Thurs, Jan 18 @ 7:30 AM  Report to [facility] Admitting at 5:30 AM  Call this number if you have problems the morning of surgery:  [phone]   Remember:  Do not eat food or drink liquids after midnight.              Stop taking Ibuprofen along with any Vitamins or Herbal Medications. No Goody's,BC's,Aleve,Advil,Motrin,Aspirin,or Fish Oil.    Do not wear jewelry.  Do not wear lotions, powders, or deoderant.             Men may shave face and neck.  Do not bring valuables to the hospital.  [facility] is not responsible for any belongings or valuables.  Contacts, dentures or bridgework may not be worn into surgery.  Leave your suitcase in the car.  After surgery it may be brought to your room.  For patients admitted to the hospital, discharge time will be determined by your treatment team.  Patients discharged the day of surgery will not be allowed to drive home.    [facility]  Before surgery, you can play an important role.  Because skin is not sterile, your skin needs to be as free of germs as possible.  You can reduce the number of germs on you skin by washing with CHG (chlorahexidine gluconate) soap before surgery.  CHG is an antiseptic cleaner which kills germs and bonds with the skin to continue killing germs even after washing.  Please DO NOT use if you have an allergy to CHG or antibacterial soaps.  If your skin becomes reddened/irritated stop using the CHG and inform your nurse when you arrive at Short Stay.  Do not shave (including legs and underarms) for at least 48 hours prior to the first CHG shower.  You may shave your face.  Please follow these instructions  carefully:   1.  Shower with CHG Soap the night before surgery and the                                morning of Surgery.  2.  If you choose to wash your hair, wash your hair first as usual with your       normal shampoo.  3.  After you shampoo, rinse your hair and body thoroughly to remove the                      Shampoo.  4.  Use CHG as you would any other liquid soap.  You can apply chg directly       to the skin and wash gently with scrungie or a clean washcloth.  5.  Apply the CHG Soap to your body ONLY FROM THE NECK DOWN.        Do not use on open wounds or open sores.  Avoid contact with your eyes,       ears, mouth and genitals (private parts).  Wash genitals (private parts)       with your normal soap.  6.  Wash thoroughly, paying special attention to the area where your surgery  will be performed.  7.  Thoroughly rinse your body with warm water from the neck down.  8.  DO NOT shower/wash with your normal soap after using and rinsing off       the CHG Soap.  9.  Pat yourself dry with a clean towel.            10.  Wear clean pajamas.            11.  Place clean sheets on your bed the night of your first shower and do not        sleep with pets.  Day of Surgery  Do not apply any lotions/deoderants the morning of surgery.  Please wear clean clothes to the hospital/surgery center.    Please read over the following fact sheets that you were given. Pain Booklet, Coughing and Deep Breathing and Surgical Site Infection Prevention

## 2016-02-21 ENCOUNTER — Encounter (HOSPITAL_COMMUNITY)
Admission: RE | Admit: 2016-02-21 | Discharge: 2016-02-21 | Disposition: A | Discharge status: Home / Self Care | Payer: BLUE CROSS/BLUE SHIELD | Source: Ambulatory Visit | Attending: General Surgery | Admitting: General Surgery

## 2016-02-21 ENCOUNTER — Encounter (HOSPITAL_COMMUNITY): Payer: Self-pay

## 2016-02-21 DIAGNOSIS — Z01812 Encounter for preprocedural laboratory examination: Secondary | ICD-10-CM | POA: Insufficient documentation

## 2016-02-21 LAB — BASIC METABOLIC PANEL
Anion gap: 4 — ABNORMAL LOW (ref 5–15)
BUN: 18 mg/dL (ref 6–20)
CHLORIDE: 106 mmol/L (ref 101–111)
CO2: 28 mmol/L (ref 22–32)
CREATININE: 0.95 mg/dL (ref 0.61–1.24)
Calcium: 11.1 mg/dL — ABNORMAL HIGH (ref 8.9–10.3)
GFR calc Af Amer: >60 mL/min (ref >60)
GFR calc non Af Amer: >60 mL/min (ref >60)
Glucose, Bld: 105 mg/dL — ABNORMAL HIGH (ref 65–99)
Potassium: 4.1 mmol/L (ref 3.5–5.1)
Sodium: 138 mmol/L (ref 135–145)

## 2016-02-21 LAB — CBC
HEMATOCRIT: 40.2 % (ref 39.0–52.0)
HEMOGLOBIN: 13.7 g/dL (ref 13.0–17.0)
MCH: 31.6 pg (ref 26.0–34.0)
MCHC: 34.1 g/dL (ref 30.0–36.0)
MCV: 92.6 fL (ref 78.0–100.0)
Platelets: 220 10*3/uL (ref 150–400)
RBC: 4.34 MIL/uL (ref 4.22–5.81)
RDW: 12.6 % (ref 11.5–15.5)
WBC: 5.8 10*3/uL (ref 4.0–10.5)

## 2016-02-21 NOTE — Progress Notes (Signed)
Pt. Seen here for hernia repair on opposite side in 04/2015. Pt. Reports everything was well with anesth. Chart was reviewed at that time by A. Kabbe, NP, no changes in health since that time. Cardiac review by Dr. Sharyn LullHarwani in 02/2015 , pt. reports that he's  feeling well, denies all chest concerns.

## 2016-02-28 ENCOUNTER — Encounter (HOSPITAL_COMMUNITY)
Admission: RE | Disposition: A | Discharge status: Home / Self Care | Payer: Self-pay | Source: Ambulatory Visit | Attending: General Surgery

## 2016-02-28 ENCOUNTER — Ambulatory Visit (HOSPITAL_COMMUNITY)
Admission: RE | Admit: 2016-02-28 | Discharge: 2016-02-28 | Disposition: A | Discharge status: Home / Self Care | Payer: BLUE CROSS/BLUE SHIELD | Source: Ambulatory Visit | Attending: General Surgery | Admitting: General Surgery

## 2016-02-28 ENCOUNTER — Ambulatory Visit (HOSPITAL_COMMUNITY): Payer: BLUE CROSS/BLUE SHIELD | Admitting: Anesthesiology

## 2016-02-28 ENCOUNTER — Encounter (HOSPITAL_COMMUNITY): Payer: Self-pay | Admitting: *Deleted

## 2016-02-28 DIAGNOSIS — K4090 Unilateral inguinal hernia, without obstruction or gangrene, not specified as recurrent: Secondary | ICD-10-CM | POA: Diagnosis not present

## 2016-02-28 DIAGNOSIS — G8918 Other acute postprocedural pain: Secondary | ICD-10-CM | POA: Diagnosis not present

## 2016-02-28 HISTORY — PX: INGUINAL HERNIA REPAIR: SHX194

## 2016-02-28 SURGERY — REPAIR, HERNIA, INGUINAL, ADULT
Anesthesia: General | Site: Groin | Laterality: Right

## 2016-02-28 MED ORDER — PROPOFOL 10 MG/ML IV BOLUS
INTRAVENOUS | Status: AC
  Filled 2016-02-28: qty 20

## 2016-02-28 MED ORDER — KETOROLAC TROMETHAMINE 30 MG/ML IJ SOLN
INTRAMUSCULAR | Status: AC
  Filled 2016-02-28: qty 1

## 2016-02-28 MED ORDER — 0.9 % SODIUM CHLORIDE (POUR BTL) OPTIME
TOPICAL | Status: DC | PRN
  Administered 2016-02-28: 1000 mL

## 2016-02-28 MED ORDER — FENTANYL CITRATE (PF) 100 MCG/2ML IJ SOLN
INTRAMUSCULAR | Status: AC
  Filled 2016-02-28: qty 2

## 2016-02-28 MED ORDER — ONDANSETRON HCL 4 MG PO TABS: 4.0000 mg | ORAL_TABLET | ORAL | 0 refills | Status: DC | PRN

## 2016-02-28 MED ORDER — LACTATED RINGERS IV SOLN
INTRAVENOUS | Status: DC | PRN
  Administered 2016-02-28 (×2): via INTRAVENOUS

## 2016-02-28 MED ORDER — ONDANSETRON HCL 4 MG/2ML IJ SOLN
INTRAMUSCULAR | Status: AC
  Filled 2016-02-28: qty 2

## 2016-02-28 MED ORDER — OXYCODONE HCL 5 MG PO TABS
5.0000 mg | ORAL_TABLET | Freq: Once | ORAL | Status: AC | PRN
  Administered 2016-02-28: 5 mg via ORAL

## 2016-02-28 MED ORDER — PROPOFOL 10 MG/ML IV BOLUS
INTRAVENOUS | Status: DC | PRN
  Administered 2016-02-28: 120 mg via INTRAVENOUS

## 2016-02-28 MED ORDER — OXYCODONE HCL 5 MG PO TABS
ORAL_TABLET | ORAL | Status: AC
  Filled 2016-02-28: qty 1

## 2016-02-28 MED ORDER — SCOPOLAMINE 1 MG/3DAYS TD PT72
MEDICATED_PATCH | TRANSDERMAL | Status: DC | PRN
  Administered 2016-02-28: 1 via TRANSDERMAL

## 2016-02-28 MED ORDER — CHLORHEXIDINE GLUCONATE CLOTH 2 % EX PADS: 6.0000 | MEDICATED_PAD | Freq: Once | CUTANEOUS | Status: DC

## 2016-02-28 MED ORDER — BUPIVACAINE-EPINEPHRINE (PF) 0.5% -1:200000 IJ SOLN
INTRAMUSCULAR | Status: AC
  Filled 2016-02-28: qty 30

## 2016-02-28 MED ORDER — ONDANSETRON HCL 4 MG/2ML IJ SOLN: 4.0000 mg | Freq: Once | INTRAMUSCULAR | Status: DC | PRN

## 2016-02-28 MED ORDER — FENTANYL CITRATE (PF) 100 MCG/2ML IJ SOLN: 100.0000 ug | Freq: Once | INTRAMUSCULAR | Status: DC

## 2016-02-28 MED ORDER — METHOCARBAMOL 1000 MG/10ML IJ SOLN
1000.0000 mg | Freq: Once | INTRAMUSCULAR | Status: DC
  Filled 2016-02-28: qty 10

## 2016-02-28 MED ORDER — LIDOCAINE 2% (20 MG/ML) 5 ML SYRINGE
INTRAMUSCULAR | Status: DC | PRN
  Administered 2016-02-28: 80 mg via INTRAVENOUS

## 2016-02-28 MED ORDER — KETOROLAC TROMETHAMINE 60 MG/2ML IM SOLN
60.0000 mg | Freq: Once | INTRAMUSCULAR | Status: AC
  Administered 2016-02-28: 60 mg via INTRAMUSCULAR
  Filled 2016-02-28: qty 2

## 2016-02-28 MED ORDER — FENTANYL CITRATE (PF) 100 MCG/2ML IJ SOLN
25.0000 ug | INTRAMUSCULAR | Status: DC | PRN
  Administered 2016-02-28 (×2): 50 ug via INTRAVENOUS

## 2016-02-28 MED ORDER — DEXAMETHASONE SODIUM PHOSPHATE 10 MG/ML IJ SOLN
INTRAMUSCULAR | Status: AC
  Filled 2016-02-28: qty 1

## 2016-02-28 MED ORDER — OXYCODONE HCL 5 MG/5ML PO SOLN: 5.0000 mg | Freq: Once | ORAL | Status: AC | PRN

## 2016-02-28 MED ORDER — BUPIVACAINE-EPINEPHRINE 0.5% -1:200000 IJ SOLN
INTRAMUSCULAR | Status: DC | PRN
  Administered 2016-02-28: 19 mL

## 2016-02-28 MED ORDER — FENTANYL CITRATE (PF) 100 MCG/2ML IJ SOLN
INTRAMUSCULAR | Status: DC | PRN
  Administered 2016-02-28 (×4): 25 ug via INTRAVENOUS

## 2016-02-28 MED ORDER — FENTANYL CITRATE (PF) 100 MCG/2ML IJ SOLN
INTRAMUSCULAR | Status: AC
  Administered 2016-02-28: 100 ug via INTRAVENOUS
  Filled 2016-02-28: qty 2

## 2016-02-28 MED ORDER — MIDAZOLAM HCL 2 MG/2ML IJ SOLN
INTRAMUSCULAR | Status: AC
  Filled 2016-02-28: qty 2

## 2016-02-28 MED ORDER — OXYCODONE HCL 5 MG PO TABS: 5.0000 mg | ORAL_TABLET | ORAL | 0 refills | Status: DC | PRN

## 2016-02-28 MED ORDER — MIDAZOLAM HCL 2 MG/2ML IJ SOLN
INTRAMUSCULAR | Status: AC
  Administered 2016-02-28: 2 mg via INTRAVENOUS
  Filled 2016-02-28: qty 2

## 2016-02-28 MED ORDER — FENTANYL CITRATE (PF) 100 MCG/2ML IJ SOLN
INTRAMUSCULAR | Status: AC
  Filled 2016-02-28: qty 4

## 2016-02-28 MED ORDER — MIDAZOLAM HCL 2 MG/2ML IJ SOLN
2.0000 mg | Freq: Once | INTRAMUSCULAR | Status: AC
  Administered 2016-02-28: 2 mg via INTRAVENOUS

## 2016-02-28 MED ORDER — CEFAZOLIN SODIUM-DEXTROSE 2-4 GM/100ML-% IV SOLN
2.0000 g | INTRAVENOUS | Status: AC
  Administered 2016-02-28: 2 g via INTRAVENOUS
  Filled 2016-02-28: qty 100

## 2016-02-28 MED ORDER — ONDANSETRON HCL 4 MG/2ML IJ SOLN
INTRAMUSCULAR | Status: DC | PRN
  Administered 2016-02-28: 4 mg via INTRAVENOUS

## 2016-02-28 MED ORDER — METHOCARBAMOL 1000 MG/10ML IJ SOLN
1000.0000 mg | INTRAMUSCULAR | Status: AC
  Administered 2016-02-28: 1000 mg via INTRAVENOUS
  Filled 2016-02-28: qty 10

## 2016-02-28 MED ORDER — METHOCARBAMOL 1000 MG/10ML IJ SOLN
500.0000 mg | INTRAVENOUS | Status: DC
  Filled 2016-02-28: qty 5

## 2016-02-28 MED ORDER — DEXAMETHASONE SODIUM PHOSPHATE 10 MG/ML IJ SOLN
INTRAMUSCULAR | Status: DC | PRN
  Administered 2016-02-28: 10 mg via INTRAVENOUS

## 2016-02-28 SURGICAL SUPPLY — 44 items
BENZOIN TINCTURE PRP APPL 2/3 (GAUZE/BANDAGES/DRESSINGS) ×3 IMPLANT
BLADE SURG 10 STRL SS (BLADE) ×3 IMPLANT
BLADE SURG 15 STRL LF DISP TIS (BLADE) ×1 IMPLANT
BLADE SURG 15 STRL SS (BLADE) ×2
BLADE SURG ROTATE 9660 (MISCELLANEOUS) IMPLANT
CHLORAPREP W/TINT 26ML (MISCELLANEOUS) ×3 IMPLANT
CLOSURE WOUND 1/2 X4 (GAUZE/BANDAGES/DRESSINGS) ×1
COVER SURGICAL LIGHT HANDLE (MISCELLANEOUS) ×3 IMPLANT
DRAIN PENROSE 1/2X12 LTX STRL (WOUND CARE) ×3 IMPLANT
DRAPE INCISE IOBAN 66X45 STRL (DRAPES) ×3 IMPLANT
DRAPE LAPAROTOMY TRNSV 102X78 (DRAPE) ×3 IMPLANT
DRAPE UTILITY XL STRL (DRAPES) ×6 IMPLANT
DRSG TEGADERM 4X4.75 (GAUZE/BANDAGES/DRESSINGS) ×3 IMPLANT
DRSG TELFA 3X8 NADH (GAUZE/BANDAGES/DRESSINGS) ×3 IMPLANT
ELECT CAUTERY BLADE 6.4 (BLADE) ×3 IMPLANT
ELECT REM PT RETURN 9FT ADLT (ELECTROSURGICAL) ×3
ELECTRODE REM PT RTRN 9FT ADLT (ELECTROSURGICAL) ×1 IMPLANT
GAUZE SPONGE 4X4 16PLY XRAY LF (GAUZE/BANDAGES/DRESSINGS) ×3 IMPLANT
GLOVE BIOGEL PI IND STRL 8 (GLOVE) ×1 IMPLANT
GLOVE BIOGEL PI INDICATOR 8 (GLOVE) ×2
GLOVE ECLIPSE 8.0 STRL XLNG CF (GLOVE) ×3 IMPLANT
GOWN STRL REUS W/ TWL LRG LVL3 (GOWN DISPOSABLE) ×2 IMPLANT
GOWN STRL REUS W/TWL LRG LVL3 (GOWN DISPOSABLE) ×4
KIT BASIN OR (CUSTOM PROCEDURE TRAY) ×3 IMPLANT
KIT ROOM TURNOVER OR (KITS) ×3 IMPLANT
MESH VENTRALIGHT ST 4X6IN (Mesh General) ×3 IMPLANT
NEEDLE HYPO 25GX1X1/2 BEV (NEEDLE) ×3 IMPLANT
NS IRRIG 1000ML POUR BTL (IV SOLUTION) ×3 IMPLANT
PACK SURGICAL SETUP 50X90 (CUSTOM PROCEDURE TRAY) ×3 IMPLANT
PAD ARMBOARD 7.5X6 YLW CONV (MISCELLANEOUS) ×3 IMPLANT
PENCIL BUTTON HOLSTER BLD 10FT (ELECTRODE) ×3 IMPLANT
SPECIMEN JAR SMALL (MISCELLANEOUS) IMPLANT
SPONGE LAP 18X18 X RAY DECT (DISPOSABLE) ×3 IMPLANT
STRIP CLOSURE SKIN 1/2X4 (GAUZE/BANDAGES/DRESSINGS) ×2 IMPLANT
SUT MON AB 4-0 PC3 18 (SUTURE) ×3 IMPLANT
SUT PROLENE 2 0 CT2 30 (SUTURE) ×6 IMPLANT
SUT SILK 2 0 SH (SUTURE) IMPLANT
SUT VIC AB 2-0 SH 18 (SUTURE) ×3 IMPLANT
SUT VIC AB 3-0 SH 27 (SUTURE) ×4
SUT VIC AB 3-0 SH 27XBRD (SUTURE) ×2 IMPLANT
SUT VICRYL AB 3 0 TIES (SUTURE) ×3 IMPLANT
SYR CONTROL 10ML LL (SYRINGE) ×3 IMPLANT
TOWEL OR 17X24 6PK STRL BLUE (TOWEL DISPOSABLE) ×3 IMPLANT
TOWEL OR 17X26 10 PK STRL BLUE (TOWEL DISPOSABLE) ×3 IMPLANT

## 2016-02-28 NOTE — Anesthesia Procedure Notes (Addendum)
Anesthesia Regional Block:  TAP block  Pre-Anesthetic Checklist: ,, timeout performed, Correct Patient, Correct Site, Correct Laterality, Correct Procedure, Correct Position, site marked, Risks and benefits discussed,  Surgical consent,  Pre-op evaluation,  At surgeon's request and post-op pain management  Laterality: Right  Prep: chloraprep       Needles:  Injection technique: Single-shot  Needle Type: Echogenic Stimulator Needle     Needle Length: 9cm 9 cm Needle Gauge: 22 and 22 G    Additional Needles:  Procedures: ultrasound guided (picture in chart) TAP block Narrative:  Start time: 02/28/2016 8:15 AM End time: 02/28/2016 8:20 AM Injection made incrementally with aspirations every 5 mL.  Performed by: Personally  Anesthesiologist: Wendi Lastra  Additional Notes: 30 cc 0.5% Bupivacaine 1:200 epi injected easily

## 2016-02-28 NOTE — H&P (View-Only) (Signed)
Robert Sloan 02/12/2016 11:40 AM Location: [facility] Patient #: [id number] DOB: 04/12/1965 Married / Language: English / Race: White Male  History of Present Illness (Glen Blatchley J. Yeiden Frenkel MD; 02/12/2016 12:05 PM) The patient is a 51 year old male.   Note:He presents today with his wife because he is developing thinks is a right inguinal hernia. A couple months ago he noticed a bulge on the right side that has progressively become larger. It causes mild discomfort at times. He is status post left inguinal hernia repair with mesh 04/26/15. This was a very large hernia extending into his scrotum. He had a prolonged period of postoperative pain but this eventually resolved. When he spoke with members of his family, multiple members have had hernia repairs in the past.  Allergies (April Staton, CMA; 02/12/2016 11:41 AM) No Known Drug Allergies 04/03/2015  Medication History (April Staton, CMA; 02/12/2016 11:41 AM) Vitamin D (Oral) Specific strength unknown - Active. No Current Medications (Taken starting 02/12/2016) Medications Reconciled    Vitals (April Staton CMA; 02/12/2016 11:41 AM) 02/12/2016 11:41 AM Weight: 181 lb Height: 70in Body Surface Area: 2 m Body Mass Index: 25.97 kg/m  Temp.: 98.3F(Oral)  Pulse: 65 (Regular)  BP: 118/78 (Sitting, Left Arm, Standard)      Physical Exam (Rahshawn Remo J. Analena Gama MD; 02/12/2016 12:05 PM)  The physical exam findings are as follows: Note:General-well-developed, well-nourished male in no acute distress.  Abdomen-soft, no umbilical hernia.  GU-left inguinal scar with solid floor. There is a right inguinal bulge extending down to the scrotum. This reduces spontaneously in the supine position. No testicular masses palpated.    Assessment & Plan (Meera Vasco J. Sanjiv Castorena MD; 02/12/2016 12:07 PM)  INGUINAL HERNIA OF RIGHT SIDE WITHOUT OBSTRUCTION OR GANGRENE (K40.90) Impression: This is mildly symptomatic. It also extends  down to the scrotum.  Plan: Open right inguinal hernia repair with mesh. I have explained the procedure, risks, and aftercare of inguinal hernia repair. Risks include but are not limited to bleeding, infection, wound problems, anesthesia, recurrence, bladder or intestine injury, urinary retention, testicular dysfunction, chronic pain, mesh problems. He seems to understand and agrees to proceed.  Emersyn Wyss, M.D. 

## 2016-02-28 NOTE — Transfer of Care (Signed)
Immediate Anesthesia Transfer of Care Note  Patient: Robert InglesDaniel D Glade  Procedure(s) Performed: Procedure(s): HERNIA REPAIR RIGHT INGUINAL ADULT (Right)  Patient Location: PACU  Anesthesia Type:General  Level of Consciousness: patient cooperative and responds to stimulation  Airway & Oxygen Therapy: Patient Spontanous Breathing and Patient connected to nasal cannula oxygen  Post-op Assessment: Report given to RN, Post -op Vital signs reviewed and stable and Patient moving all extremities X 4  Post vital signs: Reviewed and stable  Last Vitals:  Vitals:   02/28/16 0617  BP: (!) 108/54  Pulse: 61  Resp: 18  Temp: 36.4 C    Last Pain:  Vitals:   02/28/16 0617  TempSrc: Oral         Complications: No apparent anesthesia complications

## 2016-02-28 NOTE — Op Note (Signed)
OPERATIVE NOTE-INGUINAL HERNIA REPAIR  Preoperative diagnosis:  Right inguinal hernia.  Postoperative diagnosis:  Same (large indirect)  Procedure:  Right inguinal hernia repair with mesh (Ventralite 6" by 4")  Surgeon:  Avel Peaceodd Senai Ramnath, M.D.  Anesthesia:  General/LMA with TAP block and local (Marcaine).  EBL < 100 ml  Indication:  This is a 51 year old male with a reducible right inguinal hernia extending into the distal scrotum who presents for elective repair.  Technique:  He was seen in the holding room and the right groin was marked with my initials. He was brought to the operating, placed supine on the operating table, and the anesthetic was administered by the anesthesiologist. The hair in the groin area was clipped as was felt to be necessary. This area was then sterilely prepped and draped.  Local anesthetic was infiltrated in the superficial and deep tissues in the right groin.  An incision was made through the skin and subcutaneous tissue until the external oblique aponeurosis was identified.  Local anesthetic was infiltrated deep to the external oblique aponeurosis. The external oblique aponeurosis was divided through the external ring medially and back toward the anterior superior iliac spine laterally. Using blunt dissection, the shelving edge of the inguinal ligament was identified inferiorly and the internal oblique aponeurosis and muscle were identified superiorly. The ilioinguinal nerve was identified and preserved.  The spermatic cord was isolated and a posterior window was made around it. A large indirect hernia sac extending down into the scrotum densely adherent to the spermatic cord and testicle was identified and separated from the spermatic cord using blunt dissection. The sac was divided leaving part of it on the testicle. The hernia sac and its contents were then reduced through a large indirect hernia defect.   A piece of 4" x 6" ventralite mesh was brought into the  field and anchored 1-2 cm medial to the pubic tubercle with 2-0 Prolene suture. The inferior aspect of the mesh was anchored to the shelving edge of the inguinal ligament with running 2-0 Prolene suture to a level 1-2 cm lateral to the internal ring. A slit was cut in the mesh creating 2 tails. These were wrapped around the spermatic cord. The superior aspect of the mesh was anchored to the internal oblique aponeurosis and muscle with interrupted 2-0 Vicryl sutures. The 2 tails of the mesh were then crossed creating a new internal ring and were anchored to the shelving edge of the inguinal ligament with 2-0 Prolene suture. The tip of a hemostat could be placed through the new aperture. The lateral aspect of the mesh was then tucked deep to the external oblique aponeurosis.  The wound was inspected and hemostasis was adequate. The external oblique aponeurosis was then closed over the mesh and cord with running 3-0 Vicryl suture. The subcutaneous tissue was closed with running 3-0 Vicryl suture. The skin closed with a running 4-0 Monocryl subcuticular stitch.  Steri-Strips and a sterile dressing were applied.  The procedure was well-tolerated without any apparent complications and he was taken to the recovery room in satisfactory condition. The right testicle was in the distal right hemiscrotum.

## 2016-02-28 NOTE — Interval H&P Note (Signed)
History and Physical Interval Note:  02/28/2016 8:23 AM  Robert Sloan  has presented today for surgery, with the diagnosis of RIGHT INGUINAL HERNIA REPAIR  The various methods of treatment have been discussed with the patient and family. After consideration of risks, benefits and other options for treatment, the patient has consented to  Procedure(s): HERNIA REPAIR RIGHT INGUINAL ADULT (Right) as a surgical intervention .  The patient's history has been reviewed, patient examined, no change in status, stable for surgery.  I have reviewed the patient's chart and labs.  Questions were answered to the patient's satisfaction.     Viveca Beckstrom JShela Commons

## 2016-02-28 NOTE — Discharge Instructions (Addendum)
[facility] _______[facility]   INGUINAL HERNIA REPAIR: POST OP INSTRUCTIONS  Always review your discharge instruction sheet given to you by the facility where your surgery was performed. IF YOU HAVE DISABILITY OR FAMILY LEAVE FORMS, YOU MUST BRING THEM TO THE OFFICE FOR PROCESSING.   DO NOT GIVE THEM TO YOUR DOCTOR.  1. A  prescription for pain medication may be given to you upon discharge.  Take your pain medication as prescribed, if needed.  If narcotic pain medicine is not needed, then you may take acetaminophen (Tylenol) or ibuprofen (Advil) as needed. 2. Take your usually prescribed medications unless otherwise directed. 3. If you need a refill on your pain medication, please contact your pharmacy.  They will contact our office to request authorization. Prescriptions will not be filled after 5 pm or on week-ends. 4. You should follow a light diet the first 24 hours after arrival home, such as soup and crackers, etc.  Be sure to include lots of fluids daily.  Resume your normal diet the day after surgery. 5. Most patients will experience some swelling and bruising around the umbilicus or in the groin and scrotum.  Ice packs for 4 days and reclining will help.  Swelling and bruising can take several days to resolve.  6. It is common to experience some constipation if taking pain medication after surgery.  Increasing fluid intake and taking a stool softener (such as Colace) will usually help or prevent this problem from occurring.  A mild laxative (Milk of Magnesia or Miralax) should be taken according to package directions if there are no bowel movements after 48 hours. 7. Unless discharge instructions indicate otherwise, you may remove your bandages 4 days after surgery.  You may shower the day after surgery.  You may have steri-strips (small skin tapes) in place directly over the incision.  These strips should be left on the skin until they fall off.  If your surgeon used skin glue on  the incision, you may shower in 24 hours.  The glue will flake off over the next 2-3 weeks.  Any sutures or staples will be removed at the office during your follow-up visit. 8. ACTIVITIES:  You may resume light daily activities beginning the next day--such as daily self-care, walking, climbing stairs--gradually increasing activities as tolerated. Do not lie flat for the first 2-3 days. You may have sexual intercourse when it is comfortable.  Refrain from any heavy lifting or straining-nothing over 10 pounds for 6 weeks.   a. You may drive when you are no longer taking prescription pain medication, you can comfortably wear a seatbelt, and you can safely maneuver your car and apply brakes. b. RETURN TO WORK:  _Desk type work in 1-2 weeks, full duty in 6 weeks._________________________________________________________ 9. You should see your doctor in the office for a follow-up appointment approximately 2-3 weeks after your surgery.  Make sure that you call for this appointment within a day or two after you arrive home to insure a convenient appointment time. 10. OTHER INSTRUCTIONS:  ___Your hernia was very stuck to your right testicle which may lead to a lot a swelling of the testicle._______________________________________________________________________________________________________________________________________________________________________________________  WHEN TO CALL YOUR DOCTOR: 1. Fever over 101.0 2. Inability to urinate 3. Nausea and/or vomiting 4. Extreme swelling or bruising 5. Continued bleeding from incision. 6. Increased pain, redness, or drainage from the incision  The clinic staff is available to answer your questions during regular business hours.  Please dont hesitate to call and  ask to speak to one of the nurses for clinical concerns.  If you have a medical emergency, go to the nearest emergency room or call 911.  [facility] is always on call at the  hospital   [address], [address], [place], [place]  [address] ?  [address] [address], [place], [place]   [id number] [phone] ? [phone] ? FAX [phone] Web site: [email or web address]

## 2016-02-28 NOTE — Anesthesia Preprocedure Evaluation (Addendum)
Anesthesia Evaluation  Patient identified by MRN, date of birth, ID band Patient awake    Reviewed: Allergy & Precautions, NPO status , Patient's Chart, lab work & pertinent test results  Airway Mallampati: II  TM Distance: >3 FB Neck ROM: Full    Dental  (+) Teeth Intact, Dental Advisory Given   Pulmonary    breath sounds clear to auscultation       Cardiovascular  Rhythm:Regular Rate:Normal     Neuro/Psych    GI/Hepatic   Endo/Other    Renal/GU      Musculoskeletal   Abdominal   Peds  Hematology   Anesthesia Other Findings   Reproductive/Obstetrics                            Anesthesia Physical Anesthesia Plan  ASA: III  Anesthesia Plan: General and Regional   Post-op Pain Management:    Induction: Intravenous  Airway Management Planned: LMA  Additional Equipment:   Intra-op Plan:   Post-operative Plan:   Informed Consent: I have reviewed the patients History and Physical, chart, labs and discussed the procedure including the risks, benefits and alternatives for the proposed anesthesia with the patient or authorized representative who has indicated his/her understanding and acceptance.   Dental advisory given  Plan Discussed with: CRNA and Anesthesiologist  Anesthesia Plan Comments:         Anesthesia Quick Evaluation  

## 2016-02-28 NOTE — Anesthesia Postprocedure Evaluation (Addendum)
Anesthesia Post Note  Patient: Vanita InglesDaniel D Eagon  Procedure(s) Performed: Procedure(s) (LRB): HERNIA REPAIR RIGHT INGUINAL ADULT (Right)  Patient location during evaluation: PACU Anesthesia Type: General and Regional Level of consciousness: awake, awake and alert and oriented Pain management: pain level controlled Vital Signs Assessment: post-procedure vital signs reviewed and stable Respiratory status: spontaneous breathing, nonlabored ventilation and respiratory function stable Cardiovascular status: blood pressure returned to baseline Anesthetic complications: no       Last Vitals:  Vitals:   02/28/16 1200 02/28/16 1211  BP: 108/64 105/66  Pulse: 72 71  Resp: 14 16  Temp: 36.1 C     Last Pain:  Vitals:   02/28/16 1145  TempSrc:   PainSc: Asleep                 Roark Rufo COKER

## 2016-02-28 NOTE — Progress Notes (Signed)
Patient to discharge area ready to return home.  Wife came back asking multiple questions about patient's possible need for admission.  Patient was asked about pain level and desire for returning home vs. being admitted.  I offered to call Dr. Abbey Chattersosenbower for admission orders if patient felt like he needed to stay for pain control.  Patient wishes to return home and feels he can manage pain at home.  Discharge instructions reviewed and patient/wife agreeable.

## 2016-02-29 ENCOUNTER — Encounter (HOSPITAL_COMMUNITY): Payer: Self-pay | Admitting: General Surgery

## 2016-04-09 DIAGNOSIS — J069 Acute upper respiratory infection, unspecified: Secondary | ICD-10-CM | POA: Diagnosis not present

## 2016-10-02 NOTE — Addendum Note (Signed)
Addendum  created 10/02/16 0955 by Kipp Brood, MD   Sign clinical note

## 2016-10-19 DIAGNOSIS — M545 Low back pain: Secondary | ICD-10-CM | POA: Diagnosis not present

## 2017-01-09 DIAGNOSIS — Z0000 Encounter for general adult medical examination without abnormal findings: Secondary | ICD-10-CM | POA: Diagnosis not present

## 2017-01-09 DIAGNOSIS — Z13220 Encounter for screening for lipoid disorders: Secondary | ICD-10-CM | POA: Diagnosis not present

## 2017-01-09 DIAGNOSIS — Z125 Encounter for screening for malignant neoplasm of prostate: Secondary | ICD-10-CM | POA: Diagnosis not present

## 2017-08-24 DIAGNOSIS — M545 Low back pain: Secondary | ICD-10-CM | POA: Diagnosis not present

## 2017-08-26 DIAGNOSIS — M545 Low back pain: Secondary | ICD-10-CM | POA: Diagnosis not present

## 2017-09-18 DIAGNOSIS — G8929 Other chronic pain: Secondary | ICD-10-CM | POA: Diagnosis not present

## 2018-01-18 DIAGNOSIS — Z23 Encounter for immunization: Secondary | ICD-10-CM | POA: Diagnosis not present

## 2018-01-18 DIAGNOSIS — Z13220 Encounter for screening for lipoid disorders: Secondary | ICD-10-CM | POA: Diagnosis not present

## 2018-01-18 DIAGNOSIS — Z0000 Encounter for general adult medical examination without abnormal findings: Secondary | ICD-10-CM | POA: Diagnosis not present

## 2018-01-18 DIAGNOSIS — Z125 Encounter for screening for malignant neoplasm of prostate: Secondary | ICD-10-CM | POA: Diagnosis not present

## 2018-02-04 DIAGNOSIS — R0781 Pleurodynia: Secondary | ICD-10-CM | POA: Diagnosis not present

## 2018-02-05 ENCOUNTER — Emergency Department (HOSPITAL_COMMUNITY)
Admission: EM | Admit: 2018-02-05 | Discharge: 2018-02-05 | Disposition: A | Discharge status: Home / Self Care | Payer: BLUE CROSS/BLUE SHIELD | Attending: Emergency Medicine | Admitting: Emergency Medicine

## 2018-02-05 ENCOUNTER — Encounter (HOSPITAL_COMMUNITY): Payer: Self-pay | Admitting: *Deleted

## 2018-02-05 ENCOUNTER — Emergency Department (HOSPITAL_COMMUNITY): Payer: BLUE CROSS/BLUE SHIELD

## 2018-02-05 ENCOUNTER — Other Ambulatory Visit: Payer: Self-pay

## 2018-02-05 DIAGNOSIS — R748 Abnormal levels of other serum enzymes: Secondary | ICD-10-CM | POA: Diagnosis not present

## 2018-02-05 DIAGNOSIS — R079 Chest pain, unspecified: Secondary | ICD-10-CM | POA: Diagnosis not present

## 2018-02-05 DIAGNOSIS — R091 Pleurisy: Secondary | ICD-10-CM | POA: Insufficient documentation

## 2018-02-05 DIAGNOSIS — Z79899 Other long term (current) drug therapy: Secondary | ICD-10-CM | POA: Diagnosis not present

## 2018-02-05 DIAGNOSIS — R0789 Other chest pain: Secondary | ICD-10-CM | POA: Diagnosis not present

## 2018-02-05 LAB — LIPASE, BLOOD: Lipase: 83 U/L — ABNORMAL HIGH (ref 11–51)

## 2018-02-05 LAB — COMPREHENSIVE METABOLIC PANEL
ALK PHOS: 75 U/L (ref 38–126)
ALT: 22 U/L (ref 0–44)
ANION GAP: 9 (ref 5–15)
AST: 27 U/L (ref 15–41)
Albumin: 4.1 g/dL (ref 3.5–5.0)
BILIRUBIN TOTAL: 0.8 mg/dL (ref 0.3–1.2)
BUN: 16 mg/dL (ref 6–20)
CO2: 23 mmol/L (ref 22–32)
Calcium: 10.6 mg/dL — ABNORMAL HIGH (ref 8.9–10.3)
Chloride: 106 mmol/L (ref 98–111)
Creatinine, Ser: 0.91 mg/dL (ref 0.61–1.24)
GFR calc Af Amer: >60 mL/min (ref >60)
GLUCOSE: 159 mg/dL — AB (ref 70–99)
POTASSIUM: 3.9 mmol/L (ref 3.5–5.1)
Sodium: 138 mmol/L (ref 135–145)
TOTAL PROTEIN: 6.9 g/dL (ref 6.5–8.1)

## 2018-02-05 LAB — CBC
HEMATOCRIT: 41.2 % (ref 39.0–52.0)
Hemoglobin: 13.8 g/dL (ref 13.0–17.0)
MCH: 32.2 pg (ref 26.0–34.0)
MCHC: 33.5 g/dL (ref 30.0–36.0)
MCV: 96 fL (ref 80.0–100.0)
PLATELETS: 239 10*3/uL (ref 150–400)
RBC: 4.29 MIL/uL (ref 4.22–5.81)
RDW: 12.7 % (ref 11.5–15.5)
WBC: 6.9 10*3/uL (ref 4.0–10.5)
nRBC: 0 % (ref 0.0–0.2)

## 2018-02-05 LAB — TROPONIN I: Troponin I: <0.03 ng/mL (ref <0.03)

## 2018-02-05 MED ORDER — MORPHINE SULFATE (PF) 4 MG/ML IV SOLN
4.0000 mg | Freq: Once | INTRAVENOUS | Status: AC
  Administered 2018-02-05: 4 mg via INTRAVENOUS
  Filled 2018-02-05: qty 1

## 2018-02-05 MED ORDER — NAPROXEN 500 MG PO TABS: 500.0000 mg | ORAL_TABLET | Freq: Two times a day (BID) | ORAL | 0 refills | Status: AC

## 2018-02-05 MED ORDER — HYDROCODONE-ACETAMINOPHEN 5-325 MG PO TABS: 1.0000 | ORAL_TABLET | ORAL | 0 refills | Status: AC | PRN

## 2018-02-05 MED ORDER — ASPIRIN 81 MG PO CHEW
324.0000 mg | CHEWABLE_TABLET | Freq: Once | ORAL | Status: AC
  Administered 2018-02-05: 324 mg via ORAL
  Filled 2018-02-05: qty 4

## 2018-02-05 MED ORDER — IOPAMIDOL (ISOVUE-370) INJECTION 76%
INTRAVENOUS | Status: AC
  Administered 2018-02-05: 100 mL
  Filled 2018-02-05: qty 100

## 2018-02-05 NOTE — ED Provider Notes (Signed)
[facility] EMERGENCY DEPARTMENT Provider Note   CSN: [id number] Arrival date & time: 02/05/18  6578    History   Chief Complaint Chief Complaint  Patient presents with  . Chest Pain    HPI Robert Sloan is a 52 y.o. male.  HPI Pt states he has had sharp right sided chest pain for the last several days.  Initially it was intermittent but since last evening it has been constant and more severe. It radiates to his back.  He has pain when taking a deep breath. It increases with leaning over and other movements.   No fever.  No cough.  No numbness or weakness.   No hx of heart or lung disease.   He went to his doctor yesterday.  He was told the blood tests were normal. He did not have an xray.  The sx were worse today so he was instructed to come to the ED,. Past Medical History:  Diagnosis Date  . Heart murmur   . MEA 1 (multiple endocrine adenomatosis) (HCC)     There are no active problems to display for this patient.   Past Surgical History:  Procedure Laterality Date  . COLONOSCOPY  03/2015  . INGUINAL HERNIA REPAIR Left 04/26/2015   Procedure: OPEN LEFT INGUINAL HERNIA REPAIR WITH MESH;  Surgeon: Avel Peace, MD;  Location: [facility] OR;  Service: General;  Laterality: Left;  . INGUINAL HERNIA REPAIR Right 02/28/2016   Procedure: HERNIA REPAIR RIGHT INGUINAL ADULT;  Surgeon: Avel Peace, MD;  Location: [facility] OR;  Service: General;  Laterality: Right;  . INSERTION OF MESH Left 04/26/2015   Procedure: INSERTION OF MESH;  Surgeon: Avel Peace, MD;  Location[facility] OR;  Service: General;  Laterality: Left;        Home Medications    Prior to Admission medications   Medication Sig Start Date End Date Taking? Authorizing Provider  acetaminophen (TYLENOL) 500 MG tablet Take 500 mg by mouth every 6 (six) hours as needed for mild pain. For headache pain   Yes [provider]  Cholecalciferol (VITAMIN D) 2000 units CAPS Take 2,000 Units by mouth daily.     Yes [provider]  cyclobenzaprine (FLEXERIL) 10 MG tablet Take 10 mg by mouth at bedtime as needed (for overexertion of back).  01/29/16  Yes [provider]  ibuprofen (ADVIL,MOTRIN) 200 MG tablet Take 200-400 mg by mouth every 8 (eight) hours as needed (for headache).   Yes [provider]  HYDROcodone-acetaminophen (NORCO/VICODIN) 5-325 MG tablet Take 1 tablet by mouth every 4 (four) hours as needed. 02/05/18   Linwood Dibbles, MD  naproxen (NAPROSYN) 500 MG tablet Take 1 tablet (500 mg total) by mouth 2 (two) times daily with a meal. As needed for pain 02/05/18   Linwood Dibbles, MD    Family History No family history on file.  Social History Social History   Tobacco Use  . Smoking status: Never Smoker  . Smokeless tobacco: Never Used  Substance Use Topics  . Alcohol use: Yes    Alcohol/week: 1.0 - 2.0 standard drinks    Types: 1 - 2 Glasses of wine per week    Comment: occ  . Drug use: No     Allergies   Patient has no known allergies.   Review of Systems Review of Systems  All other systems reviewed and are negative.    Physical Exam Updated Vital Signs BP 110/64   Pulse (!) 58   Temp (!)  97.5 F (36.4 C) (Oral)   Resp 18   SpO2 100%   Physical Exam Vitals signs and nursing note reviewed.  Constitutional:      General: He is not in acute distress.    Appearance: He is well-developed.  HENT:     Head: Normocephalic and atraumatic.     Right Ear: External ear normal.     Left Ear: External ear normal.  Eyes:     General: No scleral icterus.       Right eye: No discharge.        Left eye: No discharge.     Conjunctiva/sclera: Conjunctivae normal.  Neck:     Musculoskeletal: Neck supple.     Trachea: No tracheal deviation.  Cardiovascular:     Rate and Rhythm: Normal rate and regular rhythm.  Pulmonary:     Effort: Pulmonary effort is normal. No respiratory distress.     Breath sounds: Normal breath sounds. No stridor. No  wheezing or rales.  Abdominal:     General: Bowel sounds are normal. There is no distension.     Palpations: Abdomen is soft.     Tenderness: There is no abdominal tenderness. There is no guarding or rebound.  Musculoskeletal:        General: No tenderness.  Skin:    General: Skin is warm and dry.     Findings: No rash.  Neurological:     Mental Status: He is alert.     Cranial Nerves: No cranial nerve deficit (no facial droop, extraocular movements intact, no slurred speech).     Sensory: No sensory deficit.     Motor: No abnormal muscle tone or seizure activity.     Coordination: Coordination normal.      ED Treatments / Results  Labs (all labs ordered are listed, but only abnormal results are displayed) Labs Reviewed  COMPREHENSIVE METABOLIC PANEL - Abnormal; Notable for the following components:      Result Value   Glucose, Bld 159 (*)    Calcium 10.6 (*)    All other components within normal limits  LIPASE, BLOOD - Abnormal; Notable for the following components:   Lipase 83 (*)    All other components within normal limits  CBC  TROPONIN I    EKG EKG Interpretation  Date/Time:  Friday February 05 2018 09:32:29 EST Ventricular Rate:  62 PR Interval:    QRS Duration: 93 QT Interval:  422 QTC Calculation: 429 R Axis:   77 Text Interpretation:  Sinus rhythm ST elev, probable normal early repol pattern No significant change since last tracing Confirmed by Linwood DibblesKnapp, Teaghan Formica [phone]) on 02/05/2018 1:14:39 PM   Radiology Dg Chest 2 View  Result Date: 02/05/2018 CLINICAL DATA:  Acute RIGHT chest pain for 4 days. EXAM: CHEST - 2 VIEW COMPARISON:  None. FINDINGS: The cardiomediastinal silhouette is unremarkable. Slightly increased RIGHT middle lobe opacity may represent mild atelectasis or airspace disease. The lungs are otherwise clear. No pleural effusion or pneumothorax. No acute or suspicious bony abnormalities identified. IMPRESSION: 1. Slightly increased might middle lobe  opacity which may represent mild atelectasis or airspace disease/pneumonia. Consider radiographic follow-up as clinically indicated. Electronically Signed   By: Harmon PierJeffrey  Hu M.D.   On: 02/05/2018 09:59   Ct Angio Chest Pe W And/or Wo Contrast  Result Date: 02/05/2018 CLINICAL DATA:  Acute onset of sharp RIGHT-sided chest pain that began 4 days ago, worse today, and worse with movement. EXAM: CT ANGIOGRAPHY CHEST WITH CONTRAST TECHNIQUE: Multidetector  CT imaging of the chest was performed using the standard protocol during bolus administration of intravenous contrast. Multiplanar CT image reconstructions and MIPs were obtained to evaluate the vascular anatomy. CONTRAST:  ISOVUE-370 IOPAMIDOL INJECTION 76% IV. COMPARISON:  None. FINDINGS: Cardiovascular: Contrast opacification of the pulmonary arteries is good. No filling defects within either main pulmonary artery or their segmental branches in either lung to suggest pulmonary embolism. Normal heart size. No pericardial effusion. No visible coronary atherosclerosis. No visible atherosclerosis involving thoracic or proximal abdominal aorta or their visible branches. Mediastinum/Nodes: No pathologically enlarged mediastinal, hilar or axillary lymph nodes. No mediastinal masses. Normal-appearing esophagus. Normal-appearing thyroid gland. Lungs/Pleura: Severe central bronchial wall thickening. Scattered areas of hyperlucency throughout both lungs. No confluent airspace consolidation. No evidence of interstitial lung disease. No parenchymal nodules or masses. No pleural effusions. Upper Abdomen: Unremarkable for the early arterial phase of enhancement. Musculoskeletal: Regional skeleton unremarkable without acute or significant osseous abnormality. Review of the MIP images confirms the above findings. IMPRESSION: 1. No evidence of pulmonary embolism. 2. Severe changes of bronchitis and/or asthma. No acute cardiopulmonary disease otherwise. Electronically Signed    By: Hulan Saas M.D.   On: 02/05/2018 12:40    Procedures Procedures (including critical care time)  Medications Ordered in ED Medications  aspirin chewable tablet 324 mg (324 mg Oral Given 02/05/18 0938)  morphine 4 MG/ML injection 4 mg (4 mg Intravenous Given 02/05/18 0938)  morphine 4 MG/ML injection 4 mg (4 mg Intravenous Given 02/05/18 1154)  iopamidol (ISOVUE-370) 76 % injection (100 mLs  Contrast Given 02/05/18 1215)     Initial Impression / Assessment and Plan / ED Course  I have reviewed the triage vital signs and the nursing notes.  Pertinent labs & imaging results that were available during my care of the patient were reviewed by me and considered in my medical decision making (see chart for details).  Clinical Course as of Feb 05 1314  Fri Feb 05, 2018  1027 Patient is having persistent severe pleuritic chest pain.  Chest x-ray shows the possibility of pneumonia however is not really had any respiratory symptoms that would suggest that pneumonia is his obvious source of pain.  Plan on CT to evaluate further for any acute pathology such as pulmonary embolism, aortic dissection   [JK]    Clinical Course User Index [JK] Linwood Dibbles, MD   Patient presented to the emergency room for evaluation of pleuritic chest pain.  He denied an abdominal pain.  He has not been have any issues with eating or drinking.  His labs were notable for a mildly elevated lipase but I do not think this is clinically significant.  He has no GI symptoms.  I also doubt biliary colic and that he has no abdominal pain.  His CT scan does not show any evidence of pulmonary embolism, dissection or pneumonia.  Does show severe bronchitic type changes.  Possible the symptoms are related to a viral bronchitis and pleurisy although he has not really had any significant cough or congestion.  At this time there is no evidence of any emergency medical condition.  Patient is stable discharge.  I will give him  prescription for Naprosyn and hydrocodone.  I recommended follow-up with his primary care doctor as well as rechecking his lipase.  Final Clinical Impressions(s) / ED Diagnoses   Final diagnoses:  Pleurisy  Elevated lipase    ED Discharge Orders         Ordered  HYDROcodone-acetaminophen (NORCO/VICODIN) 5-325 MG tablet  Every 4 hours PRN     02/05/18 1313    naproxen (NAPROSYN) 500 MG tablet  2 times daily with meals     02/05/18 1313           Linwood DibblesKnapp, Deloyd Handy, MD 02/05/18 1317

## 2018-02-05 NOTE — Discharge Instructions (Addendum)
Take the medications as needed for pain.  Follow-up with your primary doctor next week to be rechecked and consider having your lipase level rechecked.  Return to emergency room as needed for worsening symptoms.

## 2018-02-05 NOTE — ED Triage Notes (Signed)
Patient states he started having right sided chest pain onset Monday pm was seen at his PCP yest had EKG , and blood work, today states the pain is worse today worse with movement. States it is sharp in nature. Hard to get comfortable on the stretcher.

## 2018-02-05 NOTE — ED Notes (Signed)
States as long as he is sitting still he is ok. Rates pain at 4/10. Aware he is waiting on CT angio

## 2018-06-23 DIAGNOSIS — Z23 Encounter for immunization: Secondary | ICD-10-CM | POA: Diagnosis not present

## 2018-11-13 DIAGNOSIS — Z23 Encounter for immunization: Secondary | ICD-10-CM | POA: Diagnosis not present

## 2019-01-18 DIAGNOSIS — Z125 Encounter for screening for malignant neoplasm of prostate: Secondary | ICD-10-CM | POA: Diagnosis not present

## 2019-01-18 DIAGNOSIS — Z13220 Encounter for screening for lipoid disorders: Secondary | ICD-10-CM | POA: Diagnosis not present

## 2019-01-18 DIAGNOSIS — Z0000 Encounter for general adult medical examination without abnormal findings: Secondary | ICD-10-CM | POA: Diagnosis not present

## 2019-01-18 DIAGNOSIS — R7301 Impaired fasting glucose: Secondary | ICD-10-CM | POA: Diagnosis not present

## 2019-01-18 DIAGNOSIS — E3121 Multiple endocrine neoplasia [MEN] type I: Secondary | ICD-10-CM | POA: Diagnosis not present

## 2019-01-27 DIAGNOSIS — Z0000 Encounter for general adult medical examination without abnormal findings: Secondary | ICD-10-CM | POA: Diagnosis not present

## 2019-04-17 ENCOUNTER — Ambulatory Visit: Payer: BC Managed Care – PPO | Attending: Internal Medicine

## 2019-04-17 DIAGNOSIS — Z23 Encounter for immunization: Secondary | ICD-10-CM | POA: Insufficient documentation

## 2019-04-17 NOTE — Progress Notes (Signed)
   Covid-19 Vaccination Clinic  Name:  Robert Sloan    MRN: [id number] DOB: 03-Sep-1965  04/17/2019  [name] was observed post Covid-19 immunization for 15 minutes without incident. He was provided with Vaccine Information Sheet and instruction to access the V-Safe system.   [name] was instructed to call 911 with any severe reactions post vaccine: Marland Kitchen Difficulty breathing  . Swelling of face and throat  . A fast heartbeat  . A bad rash all over body  . Dizziness and weakness   Immunizations Administered    Name Date Dose VIS Date Route   Pfizer COVID-19 Vaccine 04/17/2019 12:29 PM 0.3 mL 01/21/2019 Intramuscular   Manufacturer: [employer], [employer]   Lot: [id number]   NDC: [address]-0

## 2019-05-10 ENCOUNTER — Ambulatory Visit: Payer: BC Managed Care – PPO | Attending: Internal Medicine

## 2019-05-10 DIAGNOSIS — Z23 Encounter for immunization: Secondary | ICD-10-CM

## 2019-05-10 NOTE — Progress Notes (Signed)
   Covid-19 Vaccination Clinic  Name:  Robert Sloan    MRN: [id number] DOB: 1965/12/27  05/10/2019  [name] was observed post Covid-19 immunization for 15 minutes without incident. He was provided with Vaccine Information Sheet and instruction to access the V-Safe system.   [name] was instructed to call 911 with any severe reactions post vaccine: Marland Kitchen Difficulty breathing  . Swelling of face and throat  . A fast heartbeat  . A bad rash all over body  . Dizziness and weakness   Immunizations Administered    Name Date Dose VIS Date Route   Pfizer COVID-19 Vaccine 05/10/2019  2:59 PM 0.3 mL 01/21/2019 Intramuscular   Manufacturer: [employer], [employer]   Lot: [id number]   NDC: [address]-9

## 2019-05-17 ENCOUNTER — Ambulatory Visit: Payer: BC Managed Care – PPO

## 2019-08-13 DIAGNOSIS — L237 Allergic contact dermatitis due to plants, except food: Secondary | ICD-10-CM | POA: Diagnosis not present

## 2019-08-13 DIAGNOSIS — L0889 Other specified local infections of the skin and subcutaneous tissue: Secondary | ICD-10-CM | POA: Diagnosis not present

## 2019-08-18 DIAGNOSIS — L814 Other melanin hyperpigmentation: Secondary | ICD-10-CM | POA: Diagnosis not present

## 2019-08-18 DIAGNOSIS — L570 Actinic keratosis: Secondary | ICD-10-CM | POA: Diagnosis not present

## 2019-08-18 DIAGNOSIS — L821 Other seborrheic keratosis: Secondary | ICD-10-CM | POA: Diagnosis not present

## 2019-08-18 DIAGNOSIS — D224 Melanocytic nevi of scalp and neck: Secondary | ICD-10-CM | POA: Diagnosis not present

## 2019-08-18 DIAGNOSIS — L738 Other specified follicular disorders: Secondary | ICD-10-CM | POA: Diagnosis not present

## 2019-11-16 DIAGNOSIS — Z23 Encounter for immunization: Secondary | ICD-10-CM | POA: Diagnosis not present

## 2020-01-31 DIAGNOSIS — R7301 Impaired fasting glucose: Secondary | ICD-10-CM | POA: Diagnosis not present

## 2020-01-31 DIAGNOSIS — Z13220 Encounter for screening for lipoid disorders: Secondary | ICD-10-CM | POA: Diagnosis not present

## 2020-01-31 DIAGNOSIS — Z0000 Encounter for general adult medical examination without abnormal findings: Secondary | ICD-10-CM | POA: Diagnosis not present

## 2020-01-31 DIAGNOSIS — Z1159 Encounter for screening for other viral diseases: Secondary | ICD-10-CM | POA: Diagnosis not present

## 2020-01-31 DIAGNOSIS — Z125 Encounter for screening for malignant neoplasm of prostate: Secondary | ICD-10-CM | POA: Diagnosis not present

## 2020-07-30 DIAGNOSIS — Z1152 Encounter for screening for COVID-19: Secondary | ICD-10-CM | POA: Diagnosis not present

## 2020-08-07 DIAGNOSIS — Z1159 Encounter for screening for other viral diseases: Secondary | ICD-10-CM | POA: Diagnosis not present

## 2020-08-29 DIAGNOSIS — Z1152 Encounter for screening for COVID-19: Secondary | ICD-10-CM | POA: Diagnosis not present

## 2020-09-05 DIAGNOSIS — D224 Melanocytic nevi of scalp and neck: Secondary | ICD-10-CM | POA: Diagnosis not present

## 2020-09-05 DIAGNOSIS — L570 Actinic keratosis: Secondary | ICD-10-CM | POA: Diagnosis not present

## 2020-09-05 DIAGNOSIS — D2261 Melanocytic nevi of right upper limb, including shoulder: Secondary | ICD-10-CM | POA: Diagnosis not present

## 2020-09-05 DIAGNOSIS — L821 Other seborrheic keratosis: Secondary | ICD-10-CM | POA: Diagnosis not present

## 2020-09-05 DIAGNOSIS — L814 Other melanin hyperpigmentation: Secondary | ICD-10-CM | POA: Diagnosis not present

## 2020-11-07 DIAGNOSIS — Z23 Encounter for immunization: Secondary | ICD-10-CM | POA: Diagnosis not present

## 2020-11-27 DIAGNOSIS — M4696 Unspecified inflammatory spondylopathy, lumbar region: Secondary | ICD-10-CM | POA: Diagnosis not present

## 2020-11-27 DIAGNOSIS — M545 Low back pain, unspecified: Secondary | ICD-10-CM | POA: Diagnosis not present

## 2020-12-24 DIAGNOSIS — Z13220 Encounter for screening for lipoid disorders: Secondary | ICD-10-CM | POA: Diagnosis not present

## 2020-12-24 DIAGNOSIS — Z125 Encounter for screening for malignant neoplasm of prostate: Secondary | ICD-10-CM | POA: Diagnosis not present

## 2020-12-24 DIAGNOSIS — R7301 Impaired fasting glucose: Secondary | ICD-10-CM | POA: Diagnosis not present

## 2020-12-24 DIAGNOSIS — Z0000 Encounter for general adult medical examination without abnormal findings: Secondary | ICD-10-CM | POA: Diagnosis not present

## 2021-04-10 ENCOUNTER — Other Ambulatory Visit: Payer: Self-pay | Admitting: Endocrinology

## 2021-04-10 DIAGNOSIS — E3120 Multiple endocrine neoplasia [MEN] syndrome, unspecified: Secondary | ICD-10-CM

## 2021-04-27 ENCOUNTER — Other Ambulatory Visit: Payer: Self-pay

## 2021-04-27 ENCOUNTER — Ambulatory Visit
Admission: RE | Admit: 2021-04-27 | Discharge: 2021-04-27 | Disposition: A | Discharge status: Home / Self Care | Payer: BC Managed Care – PPO | Source: Ambulatory Visit | Attending: Endocrinology | Admitting: Endocrinology

## 2021-04-27 DIAGNOSIS — K862 Cyst of pancreas: Secondary | ICD-10-CM | POA: Diagnosis not present

## 2021-04-27 DIAGNOSIS — E3120 Multiple endocrine neoplasia [MEN] syndrome, unspecified: Secondary | ICD-10-CM

## 2021-04-27 DIAGNOSIS — E3121 Multiple endocrine neoplasia [MEN] type I: Secondary | ICD-10-CM | POA: Diagnosis not present

## 2021-04-27 DIAGNOSIS — K314 Gastric diverticulum: Secondary | ICD-10-CM | POA: Diagnosis not present

## 2021-04-27 MED ORDER — GADOBENATE DIMEGLUMINE 529 MG/ML IV SOLN
15.0000 mL | Freq: Once | INTRAVENOUS | Status: AC | PRN
  Administered 2021-04-27: 15 mL via INTRAVENOUS

## 2022-06-05 DIAGNOSIS — E785 Hyperlipidemia, unspecified: Secondary | ICD-10-CM | POA: Diagnosis not present

## 2022-08-04 DIAGNOSIS — H5213 Myopia, bilateral: Secondary | ICD-10-CM | POA: Diagnosis not present

## 2022-08-04 DIAGNOSIS — H31002 Unspecified chorioretinal scars, left eye: Secondary | ICD-10-CM | POA: Diagnosis not present

## 2022-08-04 DIAGNOSIS — H524 Presbyopia: Secondary | ICD-10-CM | POA: Diagnosis not present

## 2022-08-04 DIAGNOSIS — H35411 Lattice degeneration of retina, right eye: Secondary | ICD-10-CM | POA: Diagnosis not present

## 2022-08-17 IMAGING — MR MR ABDOMEN WO/W CM
17 of 21 series · 38 of 48 positions shown · IV contrast (MULTIHANCE)
Comparison: None.

CLINICAL DATA: Multiple endocrine neoplasia.

EXAM:
MRI ABDOMEN WITHOUT AND WITH CONTRAST
TECHNIQUE: Multiplanar multisequence MR imaging of the abdomen was performed
both before and after the administration of intravenous contrast.
CONTRAST:  15mL MULTIHANCE GADOBENATE DIMEGLUMINE 529 MG/ML IV SOLN

[Series 3: T2 · coronal · 5.0mm · 1.56mm/px · 1 of 36 slices shown (1 of 4)]
[im 1/36]
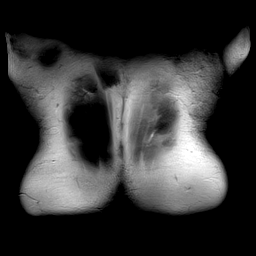

[Series 4: T1 · axial · 3.0mm · 1.19mm/px · z∈[-87,+126]mm · 4 of 144 slices shown]
[im 1/144]
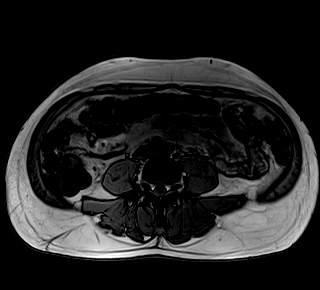
[im 48/144]
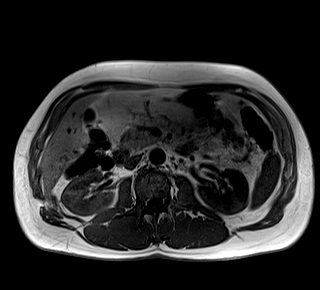
[im 96/144]
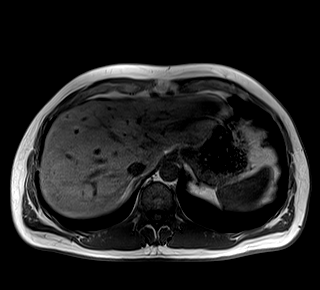
[im 144/144]
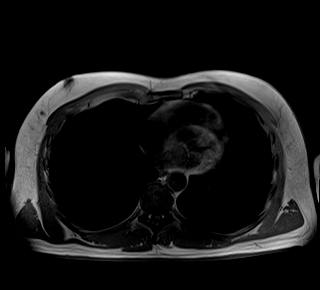

[Series 7: T2 · axial · 4.5mm · 0.56mm/px · 1 of 56 slices shown (2 of 4)]
[im 1/56]
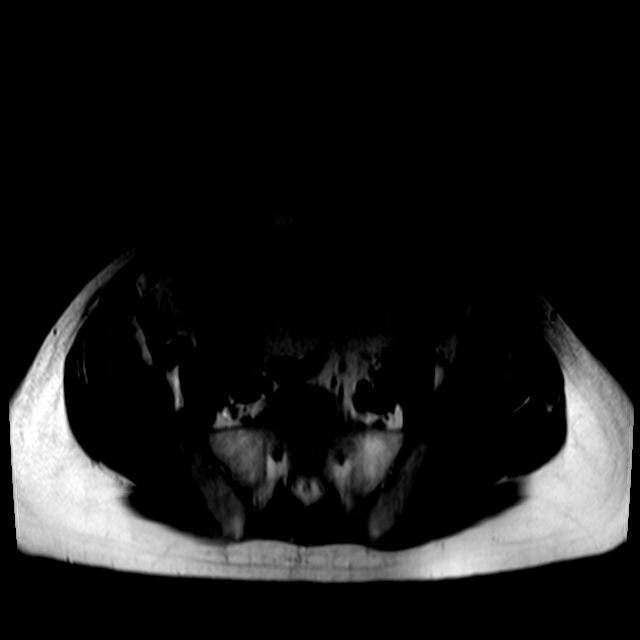

[Series 9: T2 · axial · 5.0mm · 1.48mm/px · 1 of 38 slices shown (3 of 4)]
[im 1/38]
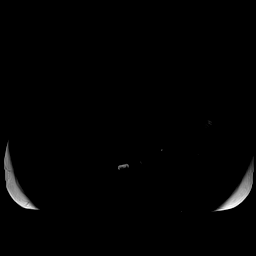

[Series 10: DWI · axial · 5.0mm · 1.42mm/px · z∈[-40,+170]mm · 4 of 108 slices shown (1 of 2)]
[im 1/108]
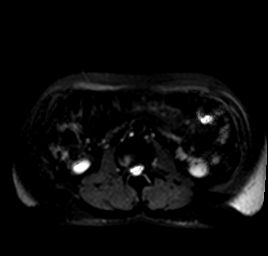
[im 36/108]
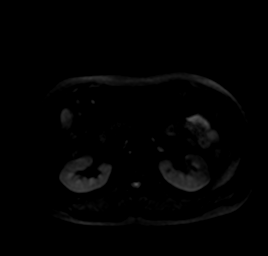
[im 72/108]
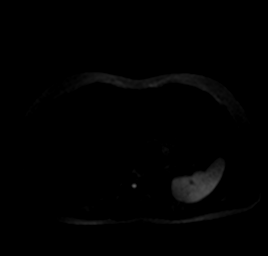
[im 108/108]
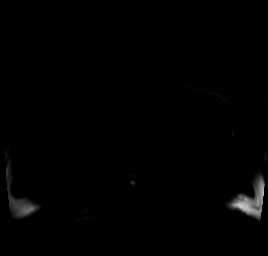

[Series 11: DWI · axial · 5.0mm · 1.42mm/px · 1 of 36 slices shown (2 of 2)]
[im 1/36]
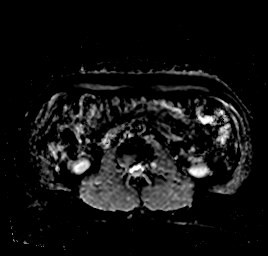

[Series 12: MRCP · coronal · 1.0mm · 0.49mm/px · 2 of 64 slices shown]
[im 1/64]
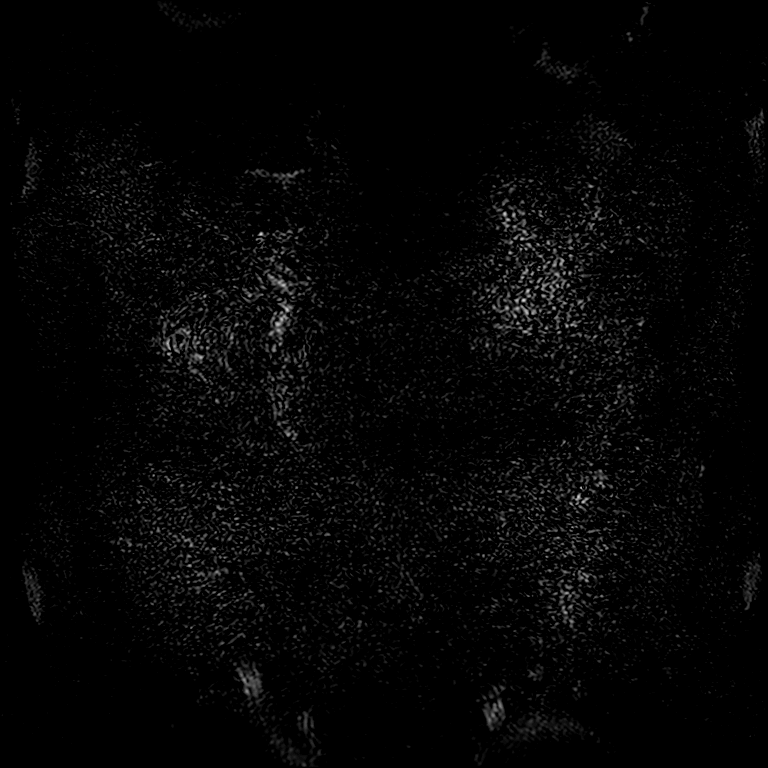
[im 64/64]
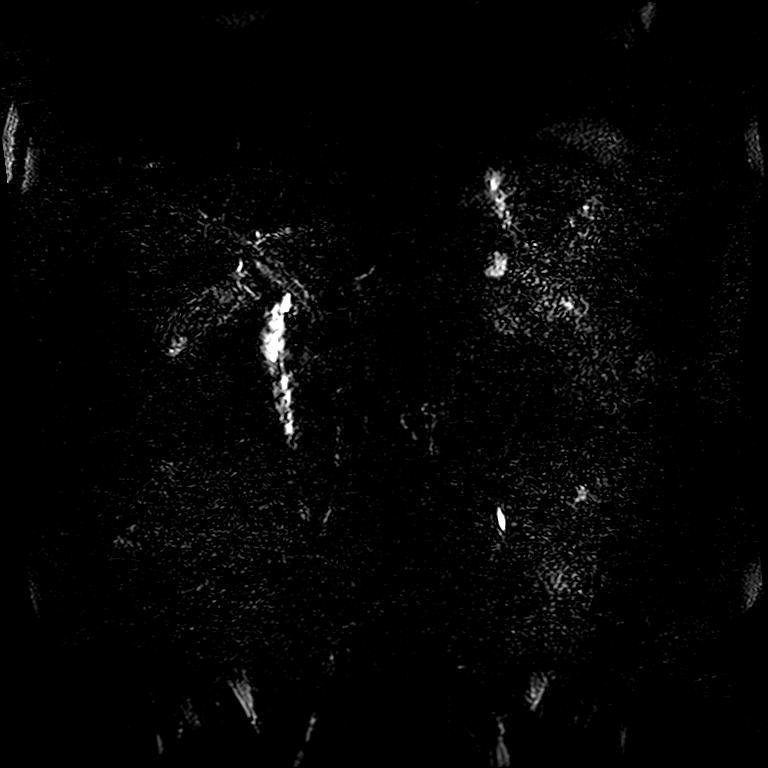

[Series 14: bSSFP · axial · 4.5mm · 0.56mm/px · 1 of 34 slices shown]
[im 1/34]
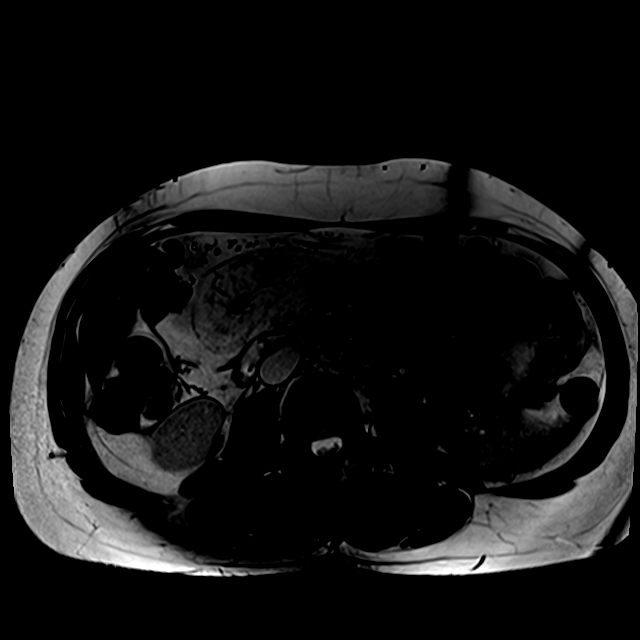

[Series 19: T2 · coronal · 3.0mm · 1.19mm/px · 1 of 19 slices shown (4 of 4)]
[im 1/19]
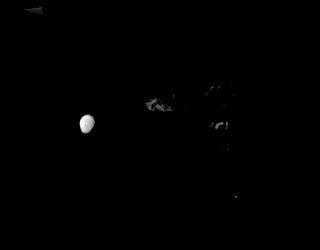

[Series 20: T1 dynamic · axial · non-contrast · 3.0mm · 1.25mm/px · z∈[-97,+116]mm · 3 of 72 slices shown]
[im 1/72]
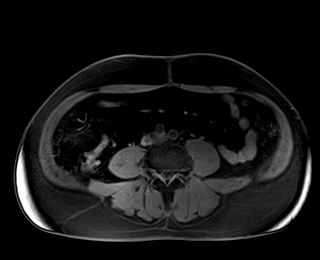
[im 36/72]
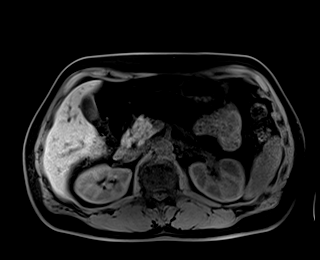
[im 72/72]
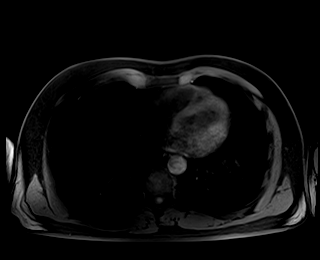

[Series 21: T1 dynamic post-contrast · axial · 3.0mm · 1.25mm/px · z∈[-97,+116]mm · 3 of 72 slices shown (1 of 7)]
[im 1/72]
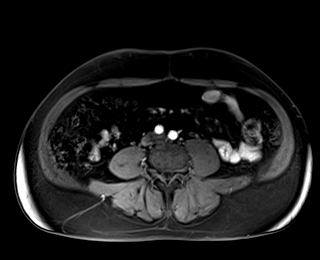
[im 36/72]
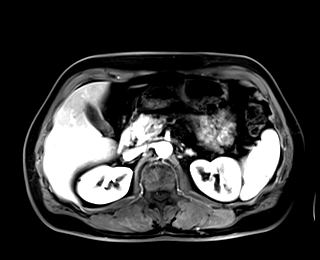
[im 72/72]
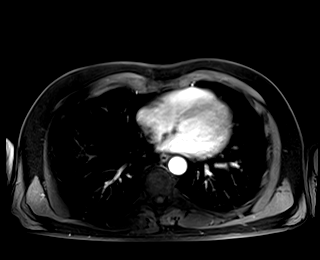

[Series 22: T1 dynamic post-contrast · axial · 3.0mm · 1.25mm/px · z∈[-97,+116]mm · 3 of 72 slices shown (2 of 7)]
[im 1/72]
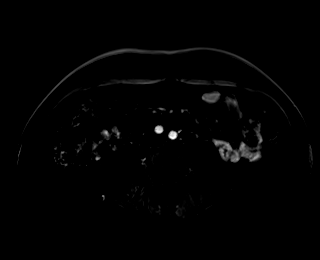
[im 36/72]
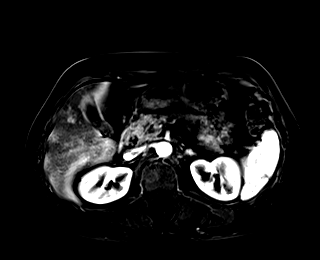
[im 72/72]
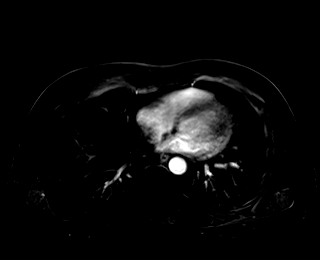

[Series 23: T1 dynamic post-contrast · axial · 3.0mm · 1.25mm/px · z∈[-97,+116]mm · 3 of 72 slices shown (3 of 7)]
[im 1/72]
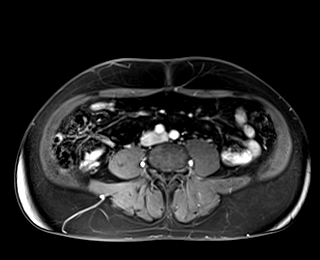
[im 36/72]
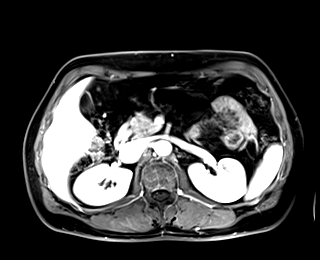
[im 72/72]
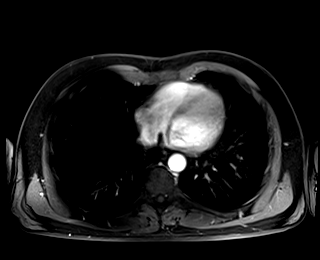

[Series 24: T1 dynamic post-contrast · axial · 3.0mm · 1.25mm/px · z∈[-97,+116]mm · 3 of 72 slices shown (4 of 7)]
[im 1/72]
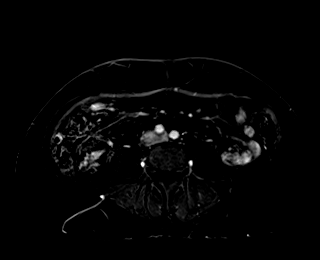
[im 36/72]
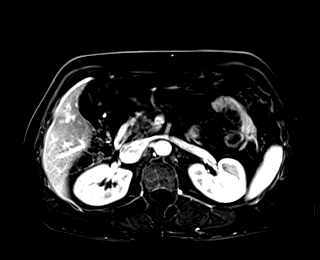
[im 72/72]
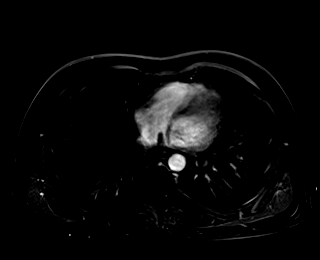

[Series 25: T1 dynamic post-contrast · axial · 3.0mm · 1.25mm/px · z∈[-97,+116]mm · 3 of 72 slices shown (5 of 7)]
[im 1/72]
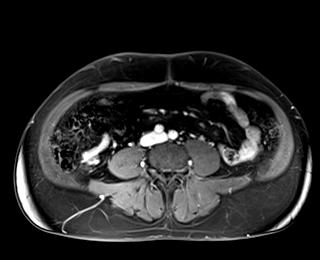
[im 36/72]
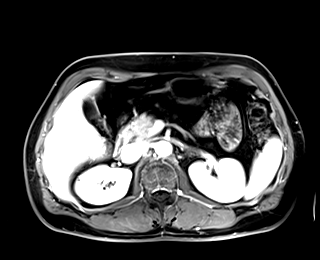
[im 72/72]
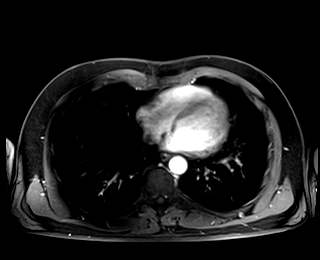

[Series 26: T1 dynamic post-contrast · axial · 3.0mm · 1.25mm/px · z∈[-97,+116]mm · 3 of 72 slices shown (6 of 7)]
[im 1/72]
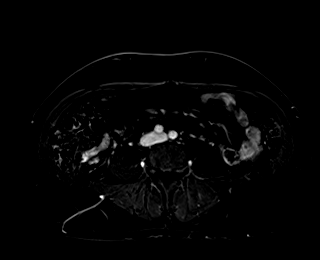
[im 36/72]
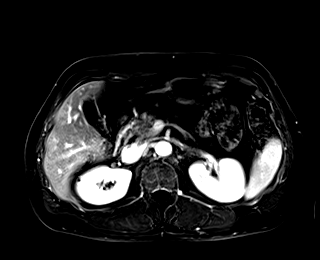
[im 72/72]
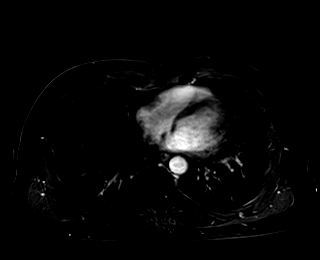

[Series 27: T1 dynamic post-contrast · coronal · 3.0mm · 1.25mm/px · 1 of 72 slices shown (7 of 7)]
[im 1/72]
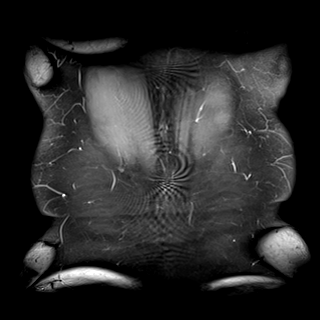

[38 of 48 positions shown; findings below may reference images not displayed]

FINDINGS: Lower chest: No acute findings.

Hepatobiliary: No hepatic masses identified. No evidence of
steatosis. Gallbladder is unremarkable. No evidence of biliary
ductal dilatation.

Pancreas: 11 mm cystic lesion is seen in the pancreatic tail (e.g.
Image 30/25), which shows no evidence contrast enhancement. No
evidence of pancreatic ductal dilatation or pancreas divisum. No
evidence of peripancreatic inflammatory changes or fluid
collections.

Spleen:  Within normal limits in size and appearance.

Adrenals/Urinary Tract: No masses identified. No evidence of
hydronephrosis.

Stomach/Bowel: 2.7 cm diverticulum arising from the posterior
gastric fundus. No evidence of mass or inflammatory process.

Vascular/Lymphatic: No pathologically enlarged lymph nodes
identified. No acute vascular findings.

Other:  None.

Musculoskeletal:  No suspicious bone lesions identified.
IMPRESSION: 11 mm cystic lesion in the pancreatic tail, suspicious for indolent
cystic neoplasm such as a side-branch IPMN. Recommend continued
follow-up by MRI in 1 year. This recommendation follows ACR
consensus guidelines: Management of Incidental Pancreatic Cysts: A
White Paper of the ACR Incidental Findings Committee. [HOSPITAL] 0494;[DATE].

Gastric diverticulum arising from the posterior fundus.

## 2022-11-19 DIAGNOSIS — E210 Primary hyperparathyroidism: Secondary | ICD-10-CM | POA: Diagnosis not present

## 2022-11-19 DIAGNOSIS — Z13220 Encounter for screening for lipoid disorders: Secondary | ICD-10-CM | POA: Diagnosis not present

## 2022-11-19 DIAGNOSIS — E3121 Multiple endocrine neoplasia [MEN] type I: Secondary | ICD-10-CM | POA: Diagnosis not present

## 2022-11-19 DIAGNOSIS — E559 Vitamin D deficiency, unspecified: Secondary | ICD-10-CM | POA: Diagnosis not present

## 2022-11-19 DIAGNOSIS — D3A8 Other benign neuroendocrine tumors: Secondary | ICD-10-CM | POA: Diagnosis not present

## 2022-11-26 DIAGNOSIS — L820 Inflamed seborrheic keratosis: Secondary | ICD-10-CM | POA: Diagnosis not present

## 2022-11-26 DIAGNOSIS — L814 Other melanin hyperpigmentation: Secondary | ICD-10-CM | POA: Diagnosis not present

## 2022-11-26 DIAGNOSIS — L821 Other seborrheic keratosis: Secondary | ICD-10-CM | POA: Diagnosis not present

## 2022-11-26 DIAGNOSIS — L570 Actinic keratosis: Secondary | ICD-10-CM | POA: Diagnosis not present

## 2022-11-26 DIAGNOSIS — D225 Melanocytic nevi of trunk: Secondary | ICD-10-CM | POA: Diagnosis not present

## 2023-01-14 DIAGNOSIS — Z125 Encounter for screening for malignant neoplasm of prostate: Secondary | ICD-10-CM | POA: Diagnosis not present

## 2023-01-14 DIAGNOSIS — N401 Enlarged prostate with lower urinary tract symptoms: Secondary | ICD-10-CM | POA: Diagnosis not present

## 2023-01-14 DIAGNOSIS — E3121 Multiple endocrine neoplasia [MEN] type I: Secondary | ICD-10-CM | POA: Diagnosis not present

## 2023-01-14 DIAGNOSIS — E785 Hyperlipidemia, unspecified: Secondary | ICD-10-CM | POA: Diagnosis not present

## 2023-01-14 DIAGNOSIS — Z0000 Encounter for general adult medical examination without abnormal findings: Secondary | ICD-10-CM | POA: Diagnosis not present

## 2023-02-13 DIAGNOSIS — R051 Acute cough: Secondary | ICD-10-CM | POA: Diagnosis not present

## 2023-02-13 DIAGNOSIS — B9689 Other specified bacterial agents as the cause of diseases classified elsewhere: Secondary | ICD-10-CM | POA: Diagnosis not present

## 2023-02-13 DIAGNOSIS — J069 Acute upper respiratory infection, unspecified: Secondary | ICD-10-CM | POA: Diagnosis not present

## 2023-06-08 DIAGNOSIS — E785 Hyperlipidemia, unspecified: Secondary | ICD-10-CM | POA: Diagnosis not present

## 2023-08-05 DIAGNOSIS — H5213 Myopia, bilateral: Secondary | ICD-10-CM | POA: Diagnosis not present

## 2023-08-05 DIAGNOSIS — H35411 Lattice degeneration of retina, right eye: Secondary | ICD-10-CM | POA: Diagnosis not present

## 2023-08-17 DIAGNOSIS — L255 Unspecified contact dermatitis due to plants, except food: Secondary | ICD-10-CM | POA: Diagnosis not present

## 2023-11-04 DIAGNOSIS — M85852 Other specified disorders of bone density and structure, left thigh: Secondary | ICD-10-CM | POA: Diagnosis not present

## 2023-11-15 DIAGNOSIS — K862 Cyst of pancreas: Secondary | ICD-10-CM | POA: Diagnosis not present

## 2023-11-15 DIAGNOSIS — E3121 Multiple endocrine neoplasia [MEN] type I: Secondary | ICD-10-CM | POA: Diagnosis not present

## 2023-11-20 DIAGNOSIS — E3121 Multiple endocrine neoplasia [MEN] type I: Secondary | ICD-10-CM | POA: Diagnosis not present

## 2023-11-20 DIAGNOSIS — Z9889 Other specified postprocedural states: Secondary | ICD-10-CM | POA: Diagnosis not present

## 2023-11-20 DIAGNOSIS — E210 Primary hyperparathyroidism: Secondary | ICD-10-CM | POA: Diagnosis not present

## 2023-11-20 DIAGNOSIS — D3A8 Other benign neuroendocrine tumors: Secondary | ICD-10-CM | POA: Diagnosis not present

## 2023-12-21 DIAGNOSIS — D225 Melanocytic nevi of trunk: Secondary | ICD-10-CM | POA: Diagnosis not present

## 2023-12-21 DIAGNOSIS — D224 Melanocytic nevi of scalp and neck: Secondary | ICD-10-CM | POA: Diagnosis not present

## 2023-12-21 DIAGNOSIS — L814 Other melanin hyperpigmentation: Secondary | ICD-10-CM | POA: Diagnosis not present

## 2023-12-21 DIAGNOSIS — L821 Other seborrheic keratosis: Secondary | ICD-10-CM | POA: Diagnosis not present

## 2024-01-20 DIAGNOSIS — E3121 Multiple endocrine neoplasia [MEN] type I: Secondary | ICD-10-CM | POA: Diagnosis not present

## 2024-01-27 DIAGNOSIS — D649 Anemia, unspecified: Secondary | ICD-10-CM | POA: Diagnosis not present

## 2024-01-27 DIAGNOSIS — E3121 Multiple endocrine neoplasia [MEN] type I: Secondary | ICD-10-CM | POA: Diagnosis not present

## 2024-01-27 DIAGNOSIS — Z0000 Encounter for general adult medical examination without abnormal findings: Secondary | ICD-10-CM | POA: Diagnosis not present

## 2024-01-27 DIAGNOSIS — Z125 Encounter for screening for malignant neoplasm of prostate: Secondary | ICD-10-CM | POA: Diagnosis not present
